# Patient Record
Sex: Male | Born: 1955
Health system: Southern US, Community
[De-identification: ages and names within clinical notes are randomized; demographics above are authoritative.]

## PROBLEM LIST (undated history)

## (undated) DIAGNOSIS — I10 Essential (primary) hypertension: Secondary | ICD-10-CM

## (undated) DIAGNOSIS — E559 Vitamin D deficiency, unspecified: Secondary | ICD-10-CM

## (undated) DIAGNOSIS — Z87442 Personal history of urinary calculi: Secondary | ICD-10-CM

## (undated) HISTORY — PX: TONSILLECTOMY: SUR1361

## (undated) HISTORY — DX: Vitamin D deficiency, unspecified: E55.9

---

## 2008-08-10 ENCOUNTER — Ambulatory Visit: Payer: Self-pay | Admitting: Diagnostic Radiology

## 2008-08-10 ENCOUNTER — Emergency Department (HOSPITAL_BASED_OUTPATIENT_CLINIC_OR_DEPARTMENT_OTHER): Admission: EM | Admit: 2008-08-10 | Discharge: 2008-08-10 | Payer: Self-pay | Admitting: Emergency Medicine

## 2011-10-25 ENCOUNTER — Encounter (HOSPITAL_BASED_OUTPATIENT_CLINIC_OR_DEPARTMENT_OTHER): Payer: Self-pay | Admitting: Family Medicine

## 2011-10-25 ENCOUNTER — Emergency Department (INDEPENDENT_AMBULATORY_CARE_PROVIDER_SITE_OTHER): Payer: PRIVATE HEALTH INSURANCE

## 2011-10-25 ENCOUNTER — Emergency Department (HOSPITAL_BASED_OUTPATIENT_CLINIC_OR_DEPARTMENT_OTHER)
Admission: EM | Admit: 2011-10-25 | Discharge: 2011-10-25 | Disposition: A | Discharge status: Home / Self Care | Payer: PRIVATE HEALTH INSURANCE | Attending: Emergency Medicine | Admitting: Emergency Medicine

## 2011-10-25 DIAGNOSIS — R109 Unspecified abdominal pain: Secondary | ICD-10-CM

## 2011-10-25 DIAGNOSIS — R112 Nausea with vomiting, unspecified: Secondary | ICD-10-CM | POA: Insufficient documentation

## 2011-10-25 LAB — URINE MICROSCOPIC-ADD ON

## 2011-10-25 LAB — URINALYSIS, ROUTINE W REFLEX MICROSCOPIC
Ketones, ur: NEGATIVE mg/dL
Leukocytes, UA: NEGATIVE
Nitrite: NEGATIVE
Protein, ur: NEGATIVE mg/dL

## 2011-10-25 LAB — BASIC METABOLIC PANEL
BUN: 17 mg/dL (ref 6–23)
GFR calc Af Amer: 70 mL/min — ABNORMAL LOW (ref >90)
GFR calc non Af Amer: 61 mL/min — ABNORMAL LOW (ref >90)
Potassium: 4.2 mEq/L (ref 3.5–5.1)
Sodium: 142 mEq/L (ref 135–145)

## 2011-10-25 LAB — CBC
HCT: 40.8 % (ref 39.0–52.0)
MCHC: 35 g/dL (ref 30.0–36.0)
RDW: 13.1 % (ref 11.5–15.5)

## 2011-10-25 MED ORDER — HYDROCODONE-ACETAMINOPHEN 5-325 MG PO TABS: 1.0000 | ORAL_TABLET | Freq: Four times a day (QID) | ORAL | Status: AC | PRN

## 2011-10-25 MED ORDER — ONDANSETRON HCL 4 MG/2ML IJ SOLN
4.0000 mg | Freq: Once | INTRAMUSCULAR | Status: AC
  Administered 2011-10-25: 4 mg via INTRAVENOUS
  Filled 2011-10-25: qty 2

## 2011-10-25 MED ORDER — KETOROLAC TROMETHAMINE 30 MG/ML IJ SOLN
30.0000 mg | Freq: Once | INTRAMUSCULAR | Status: AC
  Administered 2011-10-25: 30 mg via INTRAVENOUS
  Filled 2011-10-25: qty 1

## 2011-10-25 MED ORDER — SODIUM CHLORIDE 0.9 % IV SOLN
INTRAVENOUS | Status: DC
  Administered 2011-10-25: 10:00:00 via INTRAVENOUS

## 2011-10-25 MED ORDER — HYDROMORPHONE HCL PF 1 MG/ML IJ SOLN
1.0000 mg | Freq: Once | INTRAMUSCULAR | Status: AC
  Administered 2011-10-25: 1 mg via INTRAVENOUS
  Filled 2011-10-25: qty 1

## 2011-10-25 MED ORDER — PROMETHAZINE HCL 25 MG PO TABS: 25.0000 mg | ORAL_TABLET | Freq: Four times a day (QID) | ORAL | Status: DC | PRN

## 2011-10-25 MED ORDER — SODIUM CHLORIDE 0.9 % IV BOLUS (SEPSIS)
1000.0000 mL | Freq: Once | INTRAVENOUS | Status: AC
  Administered 2011-10-25: 1000 mL via INTRAVENOUS

## 2011-10-25 MED ORDER — NAPROXEN 500 MG PO TABS: 500.0000 mg | ORAL_TABLET | Freq: Two times a day (BID) | ORAL | Status: AC

## 2011-10-25 NOTE — ED Notes (Signed)
Pt c/o left flank pain since 5 am and with vomiting. Pt able to urinate.

## 2011-10-25 NOTE — ED Notes (Signed)
MD at bedside. 

## 2011-10-25 NOTE — ED Provider Notes (Signed)
History     CSN: [id number]  Arrival date & time 10/25/11  7564   First MD Initiated Contact with Patient 10/25/11 [phone]      Chief Complaint  Patient presents with  . Flank Pain    (Consider location/radiation/quality/duration/timing/severity/associated sxs/prior treatment) Patient is a 56 y.o. male presenting with flank pain. The history is provided by the patient and the spouse.  Flank Pain This is a new problem. The current episode started 3 to 5 hours ago. The problem occurs constantly. The problem has not changed since onset.Associated symptoms include abdominal pain. Pertinent negatives include no chest pain, no headaches and no shortness of breath. The symptoms are aggravated by nothing. The symptoms are relieved by nothing. He has tried nothing for the symptoms.   Acute onset of left-sided the flank pain at 5:00 this morning associated with nausea and vomiting no past history of kidney stones no primary care doctor currently. Pain is 10 out of 10 described as sharp not made better by anything radiates from the left flank down towards the left lower quadrant. History reviewed. No pertinent past medical history.  History reviewed. No pertinent past surgical history.  No family history on file.  History  Substance Use Topics  . Smoking status: Never Smoker   . Smokeless tobacco: Not on file  . Alcohol Use: No      Review of Systems  Constitutional: Negative for fever and chills.  HENT: Negative for congestion and neck pain.   Eyes: Negative for redness.  Respiratory: Negative for shortness of breath.   Cardiovascular: Negative for chest pain.  Gastrointestinal: Positive for nausea, vomiting and abdominal pain. Negative for diarrhea.  Genitourinary: Positive for flank pain and decreased urine volume.  Musculoskeletal: Negative for back pain.  Skin: Negative for rash.  Neurological: Negative for headaches.  Hematological: Does not bruise/bleed easily.    Allergies    Review of patient's allergies indicates no known allergies.  Home Medications   Current Outpatient Rx  Name Route Sig Dispense Refill  . ASPIRIN 325 MG PO TABS Oral Take 325 mg by mouth daily as needed.      BP 191/107  Pulse 68  Temp(Src) 97.6 F (36.4 C) (Oral)  Resp 15  Ht 5\' 11"  (1.803 m)  Wt 270 lb (122.471 kg)  BMI 37.66 kg/m2  SpO2 99%  Physical Exam  Nursing note and vitals reviewed. Constitutional: He is oriented to person, place, and time. He appears well-developed and well-nourished.  HENT:  Head: Normocephalic and atraumatic.  Mouth/Throat: Oropharynx is clear and moist.  Eyes: Conjunctivae and EOM are normal. Pupils are equal, round, and reactive to light.  Neck: Normal range of motion. Neck supple.  Cardiovascular: Normal rate and regular rhythm.   No murmur heard. Pulmonary/Chest: Effort normal and breath sounds normal.  Abdominal: Soft. Bowel sounds are normal. There is no tenderness.  Musculoskeletal: Normal range of motion. He exhibits no edema and no tenderness.  Neurological: He is alert and oriented to person, place, and time. No cranial nerve deficit. He exhibits normal muscle tone. Coordination normal.  Skin: Skin is warm. No rash noted. He is not diaphoretic.    ED Course  Procedures (including critical care time)  Labs Reviewed  URINALYSIS, ROUTINE W REFLEX MICROSCOPIC - Abnormal; Notable for the following:    Hgb urine dipstick SMALL (*)    All other components within normal limits  BASIC METABOLIC PANEL - Abnormal; Notable for the following:    Glucose, Bld 160 (*)  GFR calc non Af Amer 61 (*)    GFR calc Af Amer 70 (*)    All other components within normal limits  CBC  URINE MICROSCOPIC-ADD ON   US Renal  10/25/2011  *RADIOLOGY REPORT*  Clinical Data: Left flank pain.  RENAL/URINARY TRACT ULTRASOUND COMPLETE  Comparison:  None.  Findings:  Right Kidney:  11.6 cm. Normal size and echotexture.  No focal abnormality.  No hydronephrosis.   Left Kidney:  13.1 cm. Normal size and echotexture.  No focal abnormality.  No hydronephrosis.  Bladder:  Bilateral ureteral jets visualized.  Normal appearance.  IMPRESSION: Normal study.  Original Report Authenticated By: Cyndie Chime, M.D.   Results for orders placed during the hospital encounter of 10/25/11  URINALYSIS, ROUTINE W REFLEX MICROSCOPIC      Component Value Range   Color, Urine YELLOW  YELLOW    APPearance CLEAR  CLEAR    Specific Gravity, Urine 1.020  1.005 - 1.030    pH 5.0  5.0 - 8.0    Glucose, UA NEGATIVE  NEGATIVE (mg/dL)   Hgb urine dipstick SMALL (*) NEGATIVE    Bilirubin Urine NEGATIVE  NEGATIVE    Ketones, ur NEGATIVE  NEGATIVE (mg/dL)   Protein, ur NEGATIVE  NEGATIVE (mg/dL)   Urobilinogen, UA 0.2  0.0 - 1.0 (mg/dL)   Nitrite NEGATIVE  NEGATIVE    Leukocytes, UA NEGATIVE  NEGATIVE   CBC      Component Value Range   WBC 8.5  4.0 - 10.5 (K/uL)   RBC 4.73  4.22 - 5.81 (MIL/uL)   Hemoglobin 14.3  13.0 - 17.0 (g/dL)   HCT 16.1  09.6 - 04.5 (%)   MCV 86.3  78.0 - 100.0 (fL)   MCH 30.2  26.0 - 34.0 (pg)   MCHC 35.0  30.0 - 36.0 (g/dL)   RDW 40.9  81.1 - 91.4 (%)   Platelets 251  150 - 400 (K/uL)  BASIC METABOLIC PANEL      Component Value Range   Sodium 142  135 - 145 (mEq/L)   Potassium 4.2  3.5 - 5.1 (mEq/L)   Chloride 105  96 - 112 (mEq/L)   CO2 29  19 - 32 (mEq/L)   Glucose, Bld 160 (*) 70 - 99 (mg/dL)   BUN 17  6 - 23 (mg/dL)   Creatinine, Ser 7.82  0.50 - 1.35 (mg/dL)   Calcium 9.2  8.4 - 95.6 (mg/dL)   GFR calc non Af Amer 61 (*) >90 (mL/min)   GFR calc Af Amer 70 (*) >90 (mL/min)  URINE MICROSCOPIC-ADD ON      Component Value Range   Squamous Epithelial / LPF RARE  RARE    WBC, UA 0-2  <3 (WBC/hpf)   RBC / HPF 3-6  <3 (RBC/hpf)   Bacteria, UA RARE  RARE      1. Left flank pain       MDM  Currently CT scanner is down   and it is not known when it'll be fixed. Patient clinically has a left kidney stone based on hematuria and also  the fact that Toradol significantly helped the left flank pain. Have ordered ultrasound will be available until 11 PM.  Clinically symptoms very consistent with left renal colic mild hematuria patient needed to move with the pain also not helped much with the lauded but significantly helped portable IV. Ultrasound without any significant abnormalities still clinically feel that this is consistent with a stone. As stated above CAT  scan still not available current time is 11:00 AM  The patient's pain has resolved suspect he has passed a stone. Despite the ultrasound results pain was gone and he did do the ultrasound. Will refer urology patient will return here if pain reoccurs will check to see if CT scan is operative if not we may refer him to somewhere else.  Shelda Jakes, MD 10/25/11 [phone]

## 2011-10-25 NOTE — ED Notes (Signed)
Patient transported to Ultrasound 

## 2011-10-25 NOTE — Discharge Instructions (Signed)
Symptoms most likely consistent with a left-sided kidney stone. Take medications as needed would take the Naprosyn on every 12 hours and supplemented with the Norco as needed for recurring or worse pain. Followup with urology. Return to the emergency department for any new worse symptoms or recurrent pain. May want to call to verify CAT scan is working before arrival because that's what she would need.

## 2014-07-26 ENCOUNTER — Ambulatory Visit
Admission: RE | Admit: 2014-07-26 | Discharge: 2014-07-26 | Disposition: A | Discharge status: Home / Self Care | Payer: PRIVATE HEALTH INSURANCE | Source: Ambulatory Visit | Attending: Family Medicine | Admitting: Family Medicine

## 2014-07-26 ENCOUNTER — Other Ambulatory Visit: Payer: Self-pay | Admitting: Family Medicine

## 2014-07-26 DIAGNOSIS — R079 Chest pain, unspecified: Secondary | ICD-10-CM

## 2014-07-26 DIAGNOSIS — R0781 Pleurodynia: Secondary | ICD-10-CM

## 2014-11-12 ENCOUNTER — Ambulatory Visit (HOSPITAL_COMMUNITY)
Admission: RE | Admit: 2014-11-12 | Discharge: 2014-11-12 | Disposition: A | Discharge status: Home / Self Care | Payer: PRIVATE HEALTH INSURANCE | Source: Ambulatory Visit | Attending: Cardiology | Admitting: Cardiology

## 2014-11-12 ENCOUNTER — Other Ambulatory Visit: Payer: Self-pay | Admitting: Cardiology

## 2014-11-12 DIAGNOSIS — Z87891 Personal history of nicotine dependence: Secondary | ICD-10-CM | POA: Insufficient documentation

## 2014-11-12 DIAGNOSIS — R0602 Shortness of breath: Secondary | ICD-10-CM | POA: Insufficient documentation

## 2014-11-12 LAB — BRAIN NATRIURETIC PEPTIDE: B NATRIURETIC PEPTIDE 5: 19.1 pg/mL (ref 0.0–100.0)

## 2016-09-16 ENCOUNTER — Encounter (HOSPITAL_BASED_OUTPATIENT_CLINIC_OR_DEPARTMENT_OTHER): Payer: Self-pay | Admitting: *Deleted

## 2016-09-16 ENCOUNTER — Emergency Department (HOSPITAL_BASED_OUTPATIENT_CLINIC_OR_DEPARTMENT_OTHER): Payer: Commercial Managed Care - PPO

## 2016-09-16 ENCOUNTER — Emergency Department (HOSPITAL_BASED_OUTPATIENT_CLINIC_OR_DEPARTMENT_OTHER)
Admission: EM | Admit: 2016-09-16 | Discharge: 2016-09-16 | Disposition: A | Discharge status: Home / Self Care | Payer: Commercial Managed Care - PPO | Attending: Physician Assistant | Admitting: Physician Assistant

## 2016-09-16 DIAGNOSIS — I10 Essential (primary) hypertension: Secondary | ICD-10-CM | POA: Diagnosis not present

## 2016-09-16 DIAGNOSIS — N200 Calculus of kidney: Secondary | ICD-10-CM | POA: Insufficient documentation

## 2016-09-16 DIAGNOSIS — F17290 Nicotine dependence, other tobacco product, uncomplicated: Secondary | ICD-10-CM | POA: Diagnosis not present

## 2016-09-16 DIAGNOSIS — R109 Unspecified abdominal pain: Secondary | ICD-10-CM

## 2016-09-16 DIAGNOSIS — Z7982 Long term (current) use of aspirin: Secondary | ICD-10-CM | POA: Diagnosis not present

## 2016-09-16 HISTORY — DX: Essential (primary) hypertension: I10

## 2016-09-16 LAB — CBC WITH DIFFERENTIAL/PLATELET
BASOS PCT: 0 %
Basophils Absolute: 0 10*3/uL (ref 0.0–0.1)
Eosinophils Absolute: 0.1 10*3/uL (ref 0.0–0.7)
Eosinophils Relative: 0 %
HEMATOCRIT: 41.4 % (ref 39.0–52.0)
Hemoglobin: 14.4 g/dL (ref 13.0–17.0)
LYMPHS ABS: 2 10*3/uL (ref 0.7–4.0)
Lymphocytes Relative: 15 %
MCH: 30.1 pg (ref 26.0–34.0)
MCHC: 34.8 g/dL (ref 30.0–36.0)
MCV: 86.6 fL (ref 78.0–100.0)
MONO ABS: 1.5 10*3/uL — AB (ref 0.1–1.0)
MONOS PCT: 11 %
NEUTROS ABS: 9.9 10*3/uL — AB (ref 1.7–7.7)
Neutrophils Relative %: 74 %
Platelets: 273 10*3/uL (ref 150–400)
RBC: 4.78 MIL/uL (ref 4.22–5.81)
RDW: 13.2 % (ref 11.5–15.5)
WBC: 13.5 10*3/uL — ABNORMAL HIGH (ref 4.0–10.5)

## 2016-09-16 LAB — URINALYSIS, MICROSCOPIC (REFLEX)

## 2016-09-16 LAB — BASIC METABOLIC PANEL
Anion gap: 9 (ref 5–15)
BUN: 18 mg/dL (ref 6–20)
CALCIUM: 9.3 mg/dL (ref 8.9–10.3)
CO2: 28 mmol/L (ref 22–32)
CREATININE: 1.62 mg/dL — AB (ref 0.61–1.24)
Chloride: 98 mmol/L — ABNORMAL LOW (ref 101–111)
GFR calc non Af Amer: 45 mL/min — ABNORMAL LOW (ref >60)
GFR, EST AFRICAN AMERICAN: 52 mL/min — AB (ref >60)
GLUCOSE: 109 mg/dL — AB (ref 65–99)
Potassium: 4.1 mmol/L (ref 3.5–5.1)
Sodium: 135 mmol/L (ref 135–145)

## 2016-09-16 LAB — URINALYSIS, ROUTINE W REFLEX MICROSCOPIC
Bilirubin Urine: NEGATIVE
Glucose, UA: NEGATIVE mg/dL
Ketones, ur: 15 mg/dL — AB
LEUKOCYTES UA: NEGATIVE
Nitrite: NEGATIVE
PROTEIN: NEGATIVE mg/dL
Specific Gravity, Urine: 1.018 (ref 1.005–1.030)
pH: 6 (ref 5.0–8.0)

## 2016-09-16 MED ORDER — OXYCODONE-ACETAMINOPHEN 5-325 MG PO TABS
1.0000 | ORAL_TABLET | Freq: Once | ORAL | Status: AC
Start: 2016-09-16 — End: 2016-09-16
  Administered 2016-09-16: 1 via ORAL
  Filled 2016-09-16: qty 1

## 2016-09-16 MED ORDER — KETOROLAC TROMETHAMINE 30 MG/ML IJ SOLN
15.0000 mg | Freq: Once | INTRAMUSCULAR | Status: AC
Start: 2016-09-16 — End: 2016-09-16
  Administered 2016-09-16: 15 mg via INTRAVENOUS
  Filled 2016-09-16: qty 1

## 2016-09-16 MED ORDER — SODIUM CHLORIDE 0.9 % IV BOLUS (SEPSIS)
1000.0000 mL | Freq: Once | INTRAVENOUS | Status: AC
Start: 2016-09-16 — End: 2016-09-16
  Administered 2016-09-16: 1000 mL via INTRAVENOUS

## 2016-09-16 MED ORDER — OXYCODONE-ACETAMINOPHEN 5-325 MG PO TABS: 1.0000 | ORAL_TABLET | Freq: Four times a day (QID) | ORAL | 0 refills | Status: DC | PRN

## 2016-09-16 MED ORDER — ONDANSETRON 4 MG PO TBDP
4.0000 mg | ORAL_TABLET | Freq: Once | ORAL | Status: AC
  Administered 2016-09-16: 4 mg via ORAL
  Filled 2016-09-16: qty 1

## 2016-09-16 MED ORDER — ONDANSETRON 4 MG PO TBDP: 4.0000 mg | ORAL_TABLET | Freq: Three times a day (TID) | ORAL | 0 refills | Status: DC | PRN

## 2016-09-16 MED ORDER — TAMSULOSIN HCL 0.4 MG PO CAPS: 0.4000 mg | ORAL_CAPSULE | Freq: Every day | ORAL | 0 refills | Status: DC

## 2016-09-16 MED FILL — ONDANSETRON ODT 4 MG TABLET: 4 | 30 days supply | Qty: 15 | Fill #0

## 2016-09-16 MED FILL — TAMSULOSIN HCL 0.4 MG CAP: 0.4 | 30 days supply | Qty: 30 | Fill #0

## 2016-09-16 MED FILL — OXYCODONE/APAP 5/325 MG TAB: 5-325 | 3 days supply | Qty: 15 | Fill #0

## 2016-09-16 NOTE — ED Triage Notes (Addendum)
Right flank pain since yesterday. Hx of kidney stone in 2013. He has been taking ASA for the pain. Pt is snoring in the chair at triage before triage assessment was complete. Wife answered questions.

## 2016-09-16 NOTE — ED Provider Notes (Signed)
[facility]   CSN: [id number] Arrival date & time: 09/16/16  1118     History   Chief Complaint Chief Complaint  Patient presents with  . Flank Pain    HPI Bryan Melton is a 61 y.o. male.  HPI   Patient is a 61 year old male presenting with right flank pain. He reports started yesterday. Patient has history of kidney stones and has been treated by Dr. Venetia Constable in the past. Patient reports no fever no dysuria.  Patient had mild nausea, no vomiting.  Past Medical History:  Diagnosis Date  . Hypertension     There are no active problems to display for this patient.   History reviewed. No pertinent surgical history.     Home Medications    Prior to Admission medications   Medication Sig Start Date End Date Taking? Authorizing Provider  aspirin 325 MG tablet Take 325 mg by mouth daily as needed.    Historical Provider, MD  promethazine (PHENERGAN) 25 MG tablet Take 1 tablet (25 mg total) by mouth every 6 (six) hours as needed for nausea. 10/25/11 11/01/11  Vanetta Mulders, MD    Family History No family history on file.  Social History Social History  Substance Use Topics  . Smoking status: Current Every Day Smoker    Types: Cigars  . Smokeless tobacco: Never Used  . Alcohol use No     Allergies   Patient has no known allergies.   Review of Systems Review of Systems  Constitutional: Negative for activity change and fever.  Respiratory: Negative for cough and shortness of breath.   Cardiovascular: Negative for chest pain.  Gastrointestinal: Positive for nausea. Negative for diarrhea and vomiting.  Genitourinary: Negative for dysuria and frequency.  Musculoskeletal: Positive for back pain.  Skin: Negative for rash.  All other systems reviewed and are negative.    Physical Exam Updated Vital Signs BP 125/81 (BP Location: Right Arm)   Pulse 93   Temp 98.4 F (36.9 C) (Oral)   Resp 18   Ht  (1.803 m)   Wt 290 lb  (131.5 kg)   SpO2 98%   BMI 40.45 kg/m   Physical Exam  Constitutional: He is oriented to person, place, and time. He appears well-nourished.  HENT:  Head: Normocephalic and atraumatic.  Eyes: Conjunctivae are normal. Pupils are equal, round, and reactive to light.  Neck: Normal range of motion.  Cardiovascular: Normal rate.   Pulmonary/Chest: Effort normal and breath sounds normal.  Abdominal: Soft. He exhibits no distension. There is no tenderness.  Neurological: He is oriented to person, place, and time.  Skin: Skin is warm and dry. He is not diaphoretic.  Psychiatric: He has a normal mood and affect. His behavior is normal.     ED Treatments / Results  Labs (all labs ordered are listed, but only abnormal results are displayed) Labs Reviewed  URINALYSIS, ROUTINE W REFLEX MICROSCOPIC - Abnormal; Notable for the following:       Result Value   Hgb urine dipstick SMALL (*)    Ketones, ur 15 (*)    All other components within normal limits  URINALYSIS, MICROSCOPIC (REFLEX) - Abnormal; Notable for the following:    Bacteria, UA RARE (*)    Squamous Epithelial / LPF 0-5 (*)    All other components within normal limits  CBC WITH DIFFERENTIAL/PLATELET - Abnormal; Notable for the following:    WBC 13.5 (*)    Neutro Abs 9.9 (*)  Monocytes Absolute 1.5 (*)    All other components within normal limits  BASIC METABOLIC PANEL    EKG  EKG Interpretation None       Radiology No results found.  Procedures Procedures (including critical care time)  Medications Ordered in ED Medications - No data to display   Initial Impression / Assessment and Plan / ED Course  I have reviewed the triage vital signs and the nursing notes.  Pertinent labs & imaging results that were available during my care of the patient were reviewed by me and considered in my medical decision making (see chart for details).     Patient is a 61 year old male presenting with right leg pain.  Patient has no dysuria. Patient has no  infection seen on urine. We'll do CT to make sure is it is non obstructive. Patient appears very comfortable exam we'll give oral medications to try to control pain at this time.  2:54 PM CT shows 6 mm stone with mild right Hydro.   Anticipate ability to discharge home and follow-up with Dr. Venetia Constable as an outpatient.  Discussed with oncall urologist, Will discharge with return precautions.   Final Clinical Impressions(s) / ED Diagnoses   Final diagnoses:  None    New Prescriptions New Prescriptions   No medications on file     Valyncia Wiens Randall An, MD 09/17/16 [phone]

## 2016-09-28 ENCOUNTER — Encounter: Payer: Self-pay | Admitting: Vascular Surgery

## 2016-10-06 ENCOUNTER — Encounter: Payer: Self-pay | Admitting: Vascular Surgery

## 2016-10-13 ENCOUNTER — Encounter: Payer: Self-pay | Admitting: Vascular Surgery

## 2016-10-13 ENCOUNTER — Ambulatory Visit (INDEPENDENT_AMBULATORY_CARE_PROVIDER_SITE_OTHER): Payer: Commercial Managed Care - PPO | Admitting: Vascular Surgery

## 2016-10-13 VITALS — BP 119/75 | HR 105 | Temp 97.5°F | Resp 20 | Ht 71.0 in | Wt 294.0 lb

## 2016-10-13 DIAGNOSIS — I722 Aneurysm of renal artery: Secondary | ICD-10-CM

## 2016-10-13 NOTE — Progress Notes (Signed)
Patient name: Bryan Melton MRN: [id number] DOB: [birth year] Sex: male   REASON FOR CONSULT:    Renal artery aneurysm. Referred by Jetta Lout.   HPI:   Bryan Melton is a 61 y.o. male, who had developed right flank pain approximate week and a half ago and underwent a CT scan to workup a kidney stone. He was found to have kidney stones. 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 The patient is referred for vascular consultation.  He continues to have intermittent right flank pain and has a ureteral calculus which has not yet passed. He denies any significant gross hematuria. He states that his blood pressure has been under good control.   He does not smoke cigarettes but does smoke an occasional cigar.  Past Medical History:  Diagnosis Date  . Hydronephrosis   . Hypertension   . Vitamin D deficiency     No family history on file.  SOCIAL HISTORY: Social History   Social History  . Marital status: Single    Spouse name: N/A  . Number of children: N/A  . Years of education: N/A   Occupational History  . Not on file.   Social History Main Topics  . Smoking status: Light Tobacco Smoker    Types: Cigars  . Smokeless tobacco: Never Used     Comment: "Only when stressed"  . Alcohol use No  . Drug use: No  . Sexual activity: Not on file   Other Topics Concern  . Not on file   Social History Narrative  . No narrative on file    No Known Allergies  Current Outpatient Prescriptions  Medication Sig Dispense Refill  . amLODipine (NORVASC) 10 MG tablet TAKE 1 TABLET BY MOUTH EVERY DAY IN THE AM  1  . aspirin 325 MG tablet Take 325 mg by mouth daily as needed.    Marland Kitchen CIALIS 10 MG tablet TAKE 1 TABLET BY MOUTH EVERY 72 HOURS AS NEEDED  6  . hydrochlorothiazide (HYDRODIURIL) 25 MG tablet Take 25 mg by mouth daily.  1  . lisinopril (PRINIVIL,ZESTRIL) 40 MG tablet Take 40 mg by mouth daily.  0  . ondansetron (ZOFRAN ODT) 4 MG disintegrating tablet Take  1 tablet (4 mg total) by mouth every 8 (eight) hours as needed for nausea or vomiting. 20 tablet 0  . tamsulosin (FLOMAX) 0.4 MG CAPS capsule Take 1 capsule (0.4 mg total) by mouth daily. 30 capsule 0  . triazolam (HALCION) 0.25 MG tablet TAKE 1 TABLET BY MOUTH AT NIGHT BEFORE DENTAL APPT & 1 ONE HOUR BEFORE APPT  0  . promethazine (PHENERGAN) 25 MG tablet Take 1 tablet (25 mg total) by mouth every 6 (six) hours as needed for nausea. 10 tablet 0   No current facility-administered medications for this visit.     REVIEW OF SYSTEMS:   denotes positive finding,  denotes negative finding Cardiac  Comments:  Chest pain or chest pressure:    Shortness of breath upon exertion:    Short of breath when lying flat:    Irregular heart rhythm:        Vascular    Pain in calf, thigh, or hip brought on by ambulation:    Pain in feet at night that wakes you up from your sleep:     Blood clot in your veins:    Leg swelling:         Pulmonary    Oxygen at home:  Productive cough:     Wheezing:         Neurologic    Sudden weakness in arms or legs:     Sudden numbness in arms or legs:     Sudden onset of difficulty speaking or slurred speech:    Temporary loss of vision in one eye:     Problems with dizziness:         Gastrointestinal    Blood in stool:     Vomited blood:         Genitourinary    Burning when urinating:     Blood in urine:        Psychiatric    Major depression:         Hematologic    Bleeding problems:    Problems with blood clotting too easily:        Skin    Rashes or ulcers:        Constitutional    Fever or chills:     PHYSICAL EXAM:   Vitals:   10/13/16 1238  BP: 119/75  Pulse: (!) 105  Resp: 20  Temp: 97.5 F (36.4 C)  TempSrc: Oral  SpO2: 96%  Weight: 294 lb (133.4 kg)  Height:  (1.803 m)    GENERAL: The patient is a well-nourished male, in no acute distress. The vital signs are documented above. CARDIAC: There is a regular  rate and rhythm.  VASCULAR:I do not detect carotid bruits.  I do not detect abdominal bruits.  Both feet are warm and well-perfused.  PULMONARY: There is good air exchange bilaterally without wheezing or rales. ABDOMEN: Soft and non-tender with normal pitched bowel sounds.  MUSCULOSKELETAL: There are no major deformities or cyanosis. NEUROLOGIC: No focal weakness or paresthesias are detected. SKIN: There are no ulcers or rashes noted. PSYCHIATRIC: The patient has a normal affect.  DATA:    CT SCAN: I have reviewed the CT scan which was done on 09/16/2016. This was done to workup a kidney stone. The patient did have a 6 mm distal right ureteral calculus just proximal to the UVJ with moderate right hydronephrosis. There was a nonobstructing left renal calculus also. 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  LABS: Her creatinine on 09/16/2016 was 1.62. Creatinine clearance was 45.  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 This is fairly small and I have simply recommended a follow up CT scan in 1 year. If this does not change in size then I think we can extend his follow up out to 2 years after that and ultimately potentially longer remains unchanged in size. We have discussed the importance of good blood pressure control and I have recommended tobacco cessation. I'll see him back in 1 year with a follow up CT scan. He knows to call sooner if he has problems.  Waverly Ferrari [facility] [phone]

## 2016-10-14 NOTE — Addendum Note (Signed)
Addended by: Burton ApleyPETTY, Adamari Frede A on: 10/14/2016 09:51 AM   Modules accepted: Orders

## 2016-11-04 ENCOUNTER — Other Ambulatory Visit: Payer: Self-pay | Admitting: Urology

## 2016-11-09 NOTE — Patient Instructions (Signed)
Larence PenningLeandre Leitz  11/09/2016   Your procedure is scheduled on: 11/12/16  [facility]  [facility]trance [place]  elevators to 3rd floor to  Short Stay Center at     0815 AM.    Call this number if you have problems the morning of surgery [phone]    Remember: ONLY 1 PERSON MAY GO WITH YOU TO SHORT STAY TO GET  READY MORNING OF YOUR SURGERY.  Do not eat food or drink liquids :After Midnight.     Take these medicines the morning of surgery with A SIP OF WATER: amlodipine                                You may not have any metal on your body including hair pins and              piercings  Do not wear jewelry,, lotions, powders or perfumes, deodorant         .              Men may shave face and neck.   Do not bring valuables to the hospital. [facility] IS NOT             RESPONSIBLE   FOR VALUABLES.  Contacts, dentures or bridgework may not be worn into surgery.  Leave suitcase in the car. After surgery it may be brought to your room.     Patients discharged the day of surgery will not be allowed to drive home.  Name and phone number of your driver:  Special Instructions: N/A              Please read over the following fact sheets you were given: _____________________________________________________________________             [facility] - Pre[facility] Before surgery, you can play an important role.  Because skin is not sterile, your skin needs to be as free of germs as possible.  You can reduce the number of germs on your skin by washing with CHG (chlorahexidine gluconate) soap before surgery.  CHG is an antiseptic cleaner which kills germs and bonds with the skin to continue killing germs even after washing. Please DO NOT use if you have an allergy to CHG or antibacterial soaps.  If your skin becomes reddened/irritated stop using the CHG and inform your nurse when you arrive at Short Stay. Do not shave (including legs and underarms)  for at least 48 hours prior to the first CHG shower.  You may shave your face/neck. Please follow these instructions carefully:  1.  Shower with CHG Soap the night before surgery and the  morning of Surgery.  2.  If you choose to wash your hair, wash your hair first as usual with your  normal  shampoo.  3.  After you shampoo, rinse your hair and body thoroughly to remove the  shampoo.                           4.  Use CHG as you would any other liquid soap.  You can apply chg directly  to the skin and wash                       Gently with  a scrungie or clean washcloth.  5.  Apply the CHG Soap to your body ONLY FROM THE NECK DOWN.   Do not use on face/ open                           Wound or open sores. Avoid contact with eyes, ears mouth and genitals (private parts).                       Wash face,  Genitals (private parts) with your normal soap.             6.  Wash thoroughly, paying special attention to the area where your surgery  will be performed.  7.  Thoroughly rinse your body with warm water from the neck down.  8.  DO NOT shower/wash with your normal soap after using and rinsing off  the CHG Soap.                9.  Pat yourself dry with a clean towel.            10.  Wear clean pajamas.            11.  Place clean sheets on your bed the night of your first shower and do not  sleep with pets. Day of Surgery : Do not apply any lotions/deodorants the morning of surgery.  Please wear clean clothes to the hospital/surgery center.  FAILURE TO FOLLOW THESE INSTRUCTIONS MAY RESULT IN THE CANCELLATION OF YOUR SURGERY PATIENT SIGNATURE_________________________________  NURSE SIGNATURE__________________________________  ________________________________________________________________________

## 2016-11-09 NOTE — Progress Notes (Signed)
LOV cards 2016  On chart

## 2016-11-10 ENCOUNTER — Encounter (HOSPITAL_COMMUNITY)
Admission: RE | Admit: 2016-11-10 | Discharge: 2016-11-10 | Disposition: A | Discharge status: Home / Self Care | Payer: Commercial Managed Care - PPO | Source: Ambulatory Visit | Attending: Urology | Admitting: Urology

## 2016-11-10 ENCOUNTER — Encounter (HOSPITAL_COMMUNITY): Payer: Self-pay

## 2016-11-10 ENCOUNTER — Encounter (INDEPENDENT_AMBULATORY_CARE_PROVIDER_SITE_OTHER): Payer: Self-pay

## 2016-11-10 DIAGNOSIS — E559 Vitamin D deficiency, unspecified: Secondary | ICD-10-CM | POA: Diagnosis not present

## 2016-11-10 DIAGNOSIS — E291 Testicular hypofunction: Secondary | ICD-10-CM | POA: Diagnosis not present

## 2016-11-10 DIAGNOSIS — N135 Crossing vessel and stricture of ureter without hydronephrosis: Secondary | ICD-10-CM | POA: Diagnosis not present

## 2016-11-10 DIAGNOSIS — Z79899 Other long term (current) drug therapy: Secondary | ICD-10-CM | POA: Diagnosis not present

## 2016-11-10 DIAGNOSIS — I722 Aneurysm of renal artery: Secondary | ICD-10-CM | POA: Diagnosis not present

## 2016-11-10 DIAGNOSIS — T39315A Adverse effect of propionic acid derivatives, initial encounter: Secondary | ICD-10-CM | POA: Diagnosis not present

## 2016-11-10 DIAGNOSIS — Z720 Tobacco use: Secondary | ICD-10-CM | POA: Diagnosis not present

## 2016-11-10 DIAGNOSIS — I10 Essential (primary) hypertension: Secondary | ICD-10-CM | POA: Diagnosis not present

## 2016-11-10 DIAGNOSIS — K5903 Drug induced constipation: Secondary | ICD-10-CM | POA: Diagnosis not present

## 2016-11-10 DIAGNOSIS — N400 Enlarged prostate without lower urinary tract symptoms: Secondary | ICD-10-CM | POA: Diagnosis not present

## 2016-11-10 DIAGNOSIS — N201 Calculus of ureter: Secondary | ICD-10-CM | POA: Diagnosis not present

## 2016-11-10 DIAGNOSIS — N528 Other male erectile dysfunction: Secondary | ICD-10-CM | POA: Diagnosis not present

## 2016-11-10 DIAGNOSIS — Z6841 Body Mass Index (BMI) 40.0 and over, adult: Secondary | ICD-10-CM | POA: Diagnosis not present

## 2016-11-10 DIAGNOSIS — Z8249 Family history of ischemic heart disease and other diseases of the circulatory system: Secondary | ICD-10-CM | POA: Diagnosis not present

## 2016-11-10 DIAGNOSIS — Z7982 Long term (current) use of aspirin: Secondary | ICD-10-CM | POA: Diagnosis not present

## 2016-11-10 HISTORY — DX: Personal history of urinary calculi: Z87.442

## 2016-11-10 LAB — BASIC METABOLIC PANEL
ANION GAP: 7 (ref 5–15)
BUN: 19 mg/dL (ref 6–20)
CALCIUM: 9.6 mg/dL (ref 8.9–10.3)
CHLORIDE: 107 mmol/L (ref 101–111)
CO2: 28 mmol/L (ref 22–32)
CREATININE: 1.16 mg/dL (ref 0.61–1.24)
GFR calc non Af Amer: >60 mL/min (ref >60)
Glucose, Bld: 106 mg/dL — ABNORMAL HIGH (ref 65–99)
Potassium: 3.9 mmol/L (ref 3.5–5.1)
Sodium: 142 mmol/L (ref 135–145)

## 2016-11-10 LAB — CBC
HEMATOCRIT: 38.2 % — AB (ref 39.0–52.0)
HEMOGLOBIN: 13.1 g/dL (ref 13.0–17.0)
MCH: 29.6 pg (ref 26.0–34.0)
MCHC: 34.3 g/dL (ref 30.0–36.0)
MCV: 86.4 fL (ref 78.0–100.0)
Platelets: 280 10*3/uL (ref 150–400)
RBC: 4.42 MIL/uL (ref 4.22–5.81)
RDW: 13.4 % (ref 11.5–15.5)
WBC: 9.7 10*3/uL (ref 4.0–10.5)

## 2016-11-10 NOTE — Progress Notes (Signed)
Final ekg done 11/10/16 in epic

## 2016-11-11 MED ORDER — DEXTROSE 5 % IV SOLN
3.0000 g | INTRAVENOUS | Status: AC
  Administered 2016-11-12: 3 g via INTRAVENOUS
  Filled 2016-11-11: qty 3000

## 2016-11-11 NOTE — H&P (Signed)
Office Visit Report     11/03/2016   --------------------------------------------------------------------------------   Bryan PenningLeandre Blanton  MRN: [id number]  PRIMARY CARE:  Fanny DanceVictoria R. Rankins, MD  DOB: December 13, 1955, 61 year old Male  REFERRING:  TurkeyVictoria R. Rankins, MD    PROVIDER:  Jetta Loutiane Warden    TREATING:  Jethro BolusSigmund Sonji Starkes, M.D.    LOCATION:  [facility] [phone]   --------------------------------------------------------------------------------   CC: I have ureteral stone.  HPI: Bryan Melton is a 61 year-old male established patient who is here for ureteral stone.  The problem is on the right side. He first stated noticing pain on 09/16/2016. He is currently having flank pain and nausea. He denies having back pain, groin pain, vomiting, fever, and chills. Pain is occuring on the right side. He has not caught a stone in his urine strainer since his symptoms began.   Having some constipation with PRN pain meds. LBM was 24 hr ago. Did self treated Miralax yesterday. Pain is much improved after BM. No longer using PRN Hydrocodone (ran out) but remains on PRN Ibuprofen and daily Tamsulosin.      ALLERGIES: No Allergies    MEDICATIONS: Hydrocodone-Acetaminophen 5 mg-325 mg tablet 1 tablet PO Q 6 H PRN  Ibuprofen 800 mg tablet 1 tablet PO TID PRN  Lisinopril-Hydrochlorothiazide 20 mg-25 mg tablet Oral     GU PSH: None   NON-GU PSH: Remove Tonsils - 2014    GU PMH: Renal calculus (Acute), Left, Stable left renal calculus. - 09/24/2016 Ureteral calculus (Acute), Right, Ketorolac 60 mg IM today (refused). Stable distal right UVJ calculus. Given strainer. Instructed to strain urine and if stone captured bring to office. Will continue with daily Tamsulosin. May use NSAID for pain along with narcotic. Instructed to contact oncall over weekend if he has temp >100.5, intractable pain, or vomiting. - 09/24/2016 Ureteral obstruction secondary to calculous (Acute), Right,  Secondary to distal right UVJ calculus. - 09/24/2016 ED due to arterial insufficiency, Erectile dysfunction due to arterial insufficiency - 2015 Primary hypogonadism, Hypogonadism, testicular - 2015    NON-GU PMH: Aneurysm of renal artery (Acute), Right, [facility] for [facility] - 09/24/2016 Encounter for general adult medical examination without abnormal findings, Encounter for preventive health examination - 2015 Personal history of other diseases of the circulatory system, History of hypertension - 2014 Vitamin D deficiency, unspecified, Vitamin D deficiency - 2014    FAMILY HISTORY: Family Health Status - Mother's Age - Runs In Family Family Health Status Number - Runs In Family Father Deceased At [employer] [facility] ___ - Runs In Family hyperlipidemia - Mother Hypertension - Mother   SOCIAL HISTORY: Marital Status: Single Current Smoking Status: Patient smokes occasionally.   Tobacco Use Assessment Completed: Used Tobacco in last 30 days? Does not use smokeless tobacco. Has never drank.  Does not use drugs. Drinks 1 caffeinated drink per day.    REVIEW OF SYSTEMS:    GU Review Male:   Patient denies frequent urination, hard to postpone urination, burning/ pain with urination, get up at night to urinate, leakage of urine, stream starts and stops, trouble starting your stream, have to strain to urinate , erection problems, and penile pain.  Gastrointestinal (Upper):   Patient denies nausea, vomiting, and indigestion/ heartburn.  Gastrointestinal (Lower):   Patient denies diarrhea and constipation.  Constitutional:   Patient denies fever, night sweats, weight loss, and fatigue.  Skin:   Patient denies skin rash/ lesion and itching.  Eyes:   Patient denies  blurred vision and double vision.  Ears/ Nose/ Throat:   Patient denies sore throat and sinus problems.  Hematologic/Lymphatic:   Patient denies easy bruising and swollen glands.  Cardiovascular:   Patient denies leg swelling and  chest pains.  Respiratory:   Patient denies cough and shortness of breath.  Endocrine:   Patient denies excessive thirst.  Musculoskeletal:   Patient denies back pain and joint pain.  Neurological:   Patient denies headaches and dizziness.  Psychologic:   Patient denies depression and anxiety.   VITAL SIGNS:      11/03/2016 11:16 AM  BP 123/78 mmHg  Pulse 76 /min  Temperature 98.4 F / 37 C   MULTI-SYSTEM PHYSICAL EXAMINATION:    Constitutional: Obese. No physical deformities. Normally developed. Good grooming.   Neck: Neck symmetrical, not swollen. Normal tracheal position.  Respiratory: No labored breathing, no use of accessory muscles.   Cardiovascular: Normal temperature, normal extremity pulses, no swelling, no varicosities.   Lymphatic: No enlargement of neck, axillae, groin.  Skin: No paleness, no jaundice, no cyanosis. No lesion, no ulcer, no rash.  Neurologic / Psychiatric: Oriented to time, oriented to place, oriented to person. No depression, no anxiety, no agitation.   Gastrointestinal: Obese abdomen. No mass, no tenderness, no rigidity.   Ears, Nose, Mouth, and Throat: Left ear no scars, no lesions, no masses. Right ear no scars, no lesions, no masses. Nose no scars, no lesions, no masses. Normal hearing. Normal lips.  Musculoskeletal: Normal gait and station of head and neck.     PAST DATA REVIEWED:  Source Of History:  Patient  Records Review:   Previous Patient Records  X-Ray Review: KUB: Reviewed Films. Discussed With Patient.     10/29/16 01/03/08  PSA  Total PSA 5.45 ng/dl 1.61     09/60/45 40/98/11  Hormones  Testosterone, Total 152.6 pg/dL 914     PROCEDURES:         KUB - 74018  A single view of the abdomen is obtained.               Urinalysis Dipstick Dipstick Cont'd  Color: Yellow Bilirubin: Neg  Appearance: Clear Ketones: Neg  Specific Gravity: 1.025 Blood: Neg  pH: <=5.0 Protein: Neg  Glucose: Neg Urobilinogen: 0.2    Nitrites: Neg     Leukocyte Esterase: Neg    ASSESSMENT:      ICD-10 Details  1 GU:   Ureteral calculus - N20.1   2   Ureteral obstruction secondary to calculous - N13.2 Right          Notes:   I have reviewed CT scan, as well as multiple KUB's. The patient has continued right flank pain, and x-rays show persistence of 6 cm right lower ureteral calculus. He will be scheduled for cystoscopy extraction of right distal ureteral calculus. He will need laser, and probably double J stent.    PLAN:            Medications Stop Meds: Tamsulosin Hcl 0.4 mg capsule, ext release 24 hr 1 capsule PO Daily  Start: 09/24/2016  Stop: 11/23/2016   Discontinue: 11/03/2016  - Reason: The medication cycle was completed.  Zofran Odt 8 mg tablet,disintegrating dissolve 1 tablet PO Q 8 hours PRN N/V  Start: 10/20/2016  Discontinue: 11/03/2016  - Reason: The medication cycle was completed.            Schedule Return Visit/Planned Activity: Next Available Appointment - Schedule Surgery  Document Letter(s):  Created for Patient: Clinical Summary     Signed by Jethro Bolus, M.D. on 11/03/16 at 2:09 PM (EDT     The information contained in this medical record document is considered private and confidential patient information. This information can only be used for the medical diagnosis and/or medical services that are being provided by the patient's selected caregivers. This information can only be distributed outside of the patient's care if the patient agrees and signs waivers of authorization for this information to be sent to an outside source or route.

## 2016-11-12 ENCOUNTER — Ambulatory Visit (HOSPITAL_COMMUNITY): Payer: Commercial Managed Care - PPO | Admitting: Registered Nurse

## 2016-11-12 ENCOUNTER — Ambulatory Visit (HOSPITAL_COMMUNITY)
Admission: RE | Admit: 2016-11-12 | Discharge: 2016-11-12 | Disposition: A | Discharge status: Home / Self Care | Payer: Commercial Managed Care - PPO | Source: Ambulatory Visit | Attending: Urology | Admitting: Urology

## 2016-11-12 ENCOUNTER — Encounter (HOSPITAL_COMMUNITY): Payer: Self-pay | Admitting: *Deleted

## 2016-11-12 ENCOUNTER — Ambulatory Visit (HOSPITAL_COMMUNITY): Payer: Commercial Managed Care - PPO

## 2016-11-12 ENCOUNTER — Encounter (HOSPITAL_COMMUNITY)
Admission: RE | Disposition: A | Discharge status: Home / Self Care | Payer: Self-pay | Source: Ambulatory Visit | Attending: Urology

## 2016-11-12 DIAGNOSIS — N201 Calculus of ureter: Secondary | ICD-10-CM | POA: Insufficient documentation

## 2016-11-12 DIAGNOSIS — Z7982 Long term (current) use of aspirin: Secondary | ICD-10-CM | POA: Insufficient documentation

## 2016-11-12 DIAGNOSIS — E291 Testicular hypofunction: Secondary | ICD-10-CM | POA: Insufficient documentation

## 2016-11-12 DIAGNOSIS — Z720 Tobacco use: Secondary | ICD-10-CM | POA: Insufficient documentation

## 2016-11-12 DIAGNOSIS — N135 Crossing vessel and stricture of ureter without hydronephrosis: Secondary | ICD-10-CM | POA: Insufficient documentation

## 2016-11-12 DIAGNOSIS — N400 Enlarged prostate without lower urinary tract symptoms: Secondary | ICD-10-CM | POA: Insufficient documentation

## 2016-11-12 DIAGNOSIS — K5903 Drug induced constipation: Secondary | ICD-10-CM | POA: Insufficient documentation

## 2016-11-12 DIAGNOSIS — Z8249 Family history of ischemic heart disease and other diseases of the circulatory system: Secondary | ICD-10-CM | POA: Insufficient documentation

## 2016-11-12 DIAGNOSIS — E559 Vitamin D deficiency, unspecified: Secondary | ICD-10-CM | POA: Insufficient documentation

## 2016-11-12 DIAGNOSIS — I10 Essential (primary) hypertension: Secondary | ICD-10-CM | POA: Insufficient documentation

## 2016-11-12 DIAGNOSIS — T39315A Adverse effect of propionic acid derivatives, initial encounter: Secondary | ICD-10-CM | POA: Insufficient documentation

## 2016-11-12 DIAGNOSIS — N528 Other male erectile dysfunction: Secondary | ICD-10-CM | POA: Insufficient documentation

## 2016-11-12 DIAGNOSIS — Z79899 Other long term (current) drug therapy: Secondary | ICD-10-CM | POA: Insufficient documentation

## 2016-11-12 DIAGNOSIS — Z6841 Body Mass Index (BMI) 40.0 and over, adult: Secondary | ICD-10-CM | POA: Insufficient documentation

## 2016-11-12 DIAGNOSIS — I722 Aneurysm of renal artery: Secondary | ICD-10-CM | POA: Insufficient documentation

## 2016-11-12 HISTORY — PX: CYSTOSCOPY WITH RETROGRADE PYELOGRAM, URETEROSCOPY AND STENT PLACEMENT: SHX5789

## 2016-11-12 SURGERY — CYSTOURETEROSCOPY, WITH RETROGRADE PYELOGRAM AND STENT INSERTION
Anesthesia: General | Site: Ureter | Laterality: Right

## 2016-11-12 MED ORDER — EPHEDRINE 5 MG/ML INJ
INTRAVENOUS | Status: AC
  Filled 2016-11-12: qty 10

## 2016-11-12 MED ORDER — SODIUM CHLORIDE 0.9 % IR SOLN
Status: DC | PRN
  Administered 2016-11-12: 4000 mL

## 2016-11-12 MED ORDER — PROPOFOL 10 MG/ML IV BOLUS
INTRAVENOUS | Status: DC | PRN
  Administered 2016-11-12: 50 mg via INTRAVENOUS
  Administered 2016-11-12: 250 mg via INTRAVENOUS

## 2016-11-12 MED ORDER — LIDOCAINE 2% (20 MG/ML) 5 ML SYRINGE
INTRAMUSCULAR | Status: DC | PRN
  Administered 2016-11-12: 100 mg via INTRAVENOUS

## 2016-11-12 MED ORDER — MEPERIDINE HCL 50 MG/ML IJ SOLN: 6.2500 mg | INTRAMUSCULAR | Status: DC | PRN

## 2016-11-12 MED ORDER — PROPOFOL 10 MG/ML IV BOLUS
INTRAVENOUS | Status: AC
  Filled 2016-11-12: qty 20

## 2016-11-12 MED ORDER — FENTANYL CITRATE (PF) 100 MCG/2ML IJ SOLN
INTRAMUSCULAR | Status: DC | PRN
  Administered 2016-11-12: 25 ug via INTRAVENOUS
  Administered 2016-11-12: 50 ug via INTRAVENOUS

## 2016-11-12 MED ORDER — LACTATED RINGERS IV SOLN
INTRAVENOUS | Status: DC
  Administered 2016-11-12: 09:00:00 via INTRAVENOUS

## 2016-11-12 MED ORDER — BELLADONNA ALKALOIDS-OPIUM 16.2-60 MG RE SUPP
RECTAL | Status: DC | PRN
  Administered 2016-11-12: 1 via RECTAL

## 2016-11-12 MED ORDER — ONDANSETRON HCL 4 MG/2ML IJ SOLN
INTRAMUSCULAR | Status: DC | PRN
  Administered 2016-11-12: 4 mg via INTRAVENOUS

## 2016-11-12 MED ORDER — PROMETHAZINE HCL 25 MG/ML IJ SOLN: 6.2500 mg | INTRAMUSCULAR | Status: DC | PRN

## 2016-11-12 MED ORDER — HYOSCYAMINE SULFATE SL 0.125 MG SL SUBL: 0.1250 mg | SUBLINGUAL_TABLET | Freq: Two times a day (BID) | SUBLINGUAL | 0 refills | Status: AC | PRN

## 2016-11-12 MED ORDER — MIDAZOLAM HCL 2 MG/2ML IJ SOLN: 0.5000 mg | Freq: Once | INTRAMUSCULAR | Status: DC | PRN

## 2016-11-12 MED ORDER — TRAMADOL-ACETAMINOPHEN 37.5-325 MG PO TABS: 1.0000 | ORAL_TABLET | Freq: Four times a day (QID) | ORAL | 0 refills | Status: AC | PRN

## 2016-11-12 MED ORDER — PHENYLEPHRINE 40 MCG/ML (10ML) SYRINGE FOR IV PUSH (FOR BLOOD PRESSURE SUPPORT)
PREFILLED_SYRINGE | INTRAVENOUS | Status: DC | PRN
  Administered 2016-11-12 (×2): 80 ug via INTRAVENOUS

## 2016-11-12 MED ORDER — KETOROLAC TROMETHAMINE 30 MG/ML IJ SOLN
INTRAMUSCULAR | Status: DC | PRN
  Administered 2016-11-12: 30 mg via INTRAVENOUS

## 2016-11-12 MED ORDER — EPHEDRINE SULFATE-NACL 50-0.9 MG/10ML-% IV SOSY
PREFILLED_SYRINGE | INTRAVENOUS | Status: DC | PRN
  Administered 2016-11-12: 10 mg via INTRAVENOUS
  Administered 2016-11-12: 5 mg via INTRAVENOUS

## 2016-11-12 MED ORDER — FENTANYL CITRATE (PF) 100 MCG/2ML IJ SOLN
INTRAMUSCULAR | Status: AC
  Filled 2016-11-12: qty 2

## 2016-11-12 MED ORDER — TRIMETHOPRIM 100 MG PO TABS: 100.0000 mg | ORAL_TABLET | ORAL | 0 refills | Status: AC

## 2016-11-12 MED ORDER — MIDAZOLAM HCL 2 MG/2ML IJ SOLN
INTRAMUSCULAR | Status: AC
  Filled 2016-11-12: qty 2

## 2016-11-12 MED ORDER — MIDAZOLAM HCL 5 MG/5ML IJ SOLN
INTRAMUSCULAR | Status: DC | PRN
  Administered 2016-11-12: 2 mg via INTRAVENOUS

## 2016-11-12 MED ORDER — TAMSULOSIN HCL 0.4 MG PO CAPS: 0.4000 mg | ORAL_CAPSULE | Freq: Every day | ORAL | 0 refills | Status: AC

## 2016-11-12 MED ORDER — FENTANYL CITRATE (PF) 100 MCG/2ML IJ SOLN: 25.0000 ug | INTRAMUSCULAR | Status: DC | PRN

## 2016-11-12 MED ORDER — PHENYLEPHRINE 40 MCG/ML (10ML) SYRINGE FOR IV PUSH (FOR BLOOD PRESSURE SUPPORT)
PREFILLED_SYRINGE | INTRAVENOUS | Status: AC
  Filled 2016-11-12: qty 10

## 2016-11-12 MED ORDER — ONDANSETRON HCL 4 MG/2ML IJ SOLN
INTRAMUSCULAR | Status: AC
  Filled 2016-11-12: qty 2

## 2016-11-12 MED ORDER — ACETAMINOPHEN 500 MG PO TABS
1000.0000 mg | ORAL_TABLET | ORAL | Status: AC
  Administered 2016-11-12: 1000 mg via ORAL
  Filled 2016-11-12: qty 2

## 2016-11-12 SURGICAL SUPPLY — 21 items
BAG URO CATCHER STRL LF (MISCELLANEOUS) ×3 IMPLANT
BASKET STNLS GEMINI 4WIRE 3FR (BASKET) IMPLANT
BASKET ZERO TIP NITINOL 2.4FR (BASKET) ×3 IMPLANT
CATH INTERMIT  6FR 70CM (CATHETERS) ×3 IMPLANT
CATH URET DUAL LUMEN 6-10FR 50 (CATHETERS) IMPLANT
CLOTH BEACON ORANGE TIMEOUT ST (SAFETY) ×3 IMPLANT
COVER SURGICAL LIGHT HANDLE (MISCELLANEOUS) ×3 IMPLANT
FIBER LASER FLEXIVA 365 (UROLOGICAL SUPPLIES) ×3 IMPLANT
FIBER LASER TRAC TIP (UROLOGICAL SUPPLIES) IMPLANT
GLOVE BIOGEL M STRL SZ7.5 (GLOVE) ×3 IMPLANT
GOWN STRL REUS W/TWL XL LVL3 (GOWN DISPOSABLE) ×3 IMPLANT
GUIDEWIRE STR DUAL SENSOR (WIRE) ×3 IMPLANT
IV NS 1000ML (IV SOLUTION) ×2
IV NS 1000ML BAXH (IV SOLUTION) ×1 IMPLANT
MANIFOLD NEPTUNE II (INSTRUMENTS) ×3 IMPLANT
PACK CYSTO (CUSTOM PROCEDURE TRAY) ×3 IMPLANT
SHEATH ACCESS URETERAL 38CM (SHEATH) ×3 IMPLANT
STENT URET 6FRX24 CONTOUR (STENTS) ×3 IMPLANT
SYRINGE IRR TOOMEY STRL 70CC (SYRINGE) ×3 IMPLANT
TUBING CONNECTING 10 (TUBING) ×2 IMPLANT
TUBING CONNECTING 10' (TUBING) ×1

## 2016-11-12 NOTE — Discharge Instructions (Addendum)
Dietary Guidelines to Help Prevent Kidney Stones Kidney stones are deposits of minerals and salts that form inside your kidneys. Your risk of developing kidney stones may be greater depending on your diet, your lifestyle, the medicines you take, and whether you have certain medical conditions. Most people can reduce their chances of developing kidney stones by following the instructions below. Depending on your overall health and the type of kidney stones you tend to develop, your dietitian may give you more specific instructions. What are tips for following this plan? Reading food labels   Choose foods with "no salt added" or "low-salt" labels. Limit your sodium intake to less than 1500 mg per day.  Choose foods with calcium for each meal and snack. Try to eat about 300 mg of calcium at each meal. Foods that contain 200-500 mg of calcium per serving include:  8 oz (237 ml) of milk, fortified nondairy milk, and fortified fruit juice.  8 oz (237 ml) of kefir, yogurt, and soy yogurt.  4 oz (118 ml) of tofu.  1 oz of cheese.  1 cup (300 g) of dried figs.  1 cup (91 g) of cooked broccoli.  1-3 oz can of sardines or mackerel.  Most people need 1000 to 1500 mg of calcium each day. Talk to your dietitian about how much calcium is recommended for you. Shopping   Buy plenty of fresh fruits and vegetables. Most people do not need to avoid fruits and vegetables, even if they contain nutrients that may contribute to kidney stones.  When shopping for convenience foods, choose:  Whole pieces of fruit.  Premade salads with dressing on the side.  Low-fat fruit and yogurt smoothies.  Avoid buying frozen meals or prepared deli foods.  Look for foods with live cultures, such as yogurt and kefir. Cooking   Do not add salt to food when cooking. Place a salt shaker on the table and allow each person to add his or her own salt to taste.  Use vegetable protein, such as beans, textured vegetable  protein (TVP), or tofu instead of meat in pasta, casseroles, and soups. Meal planning   Eat less salt, if told by your dietitian. To do this:  Avoid eating processed or premade food.  Avoid eating fast food.  Eat less animal protein, including cheese, meat, poultry, or fish, if told by your dietitian. To do this:  Limit the number of times you have meat, poultry, fish, or cheese each week. Eat a diet free of meat at least 2 days a week.  Eat only one serving each day of meat, poultry, fish, or seafood.  When you prepare animal protein, cut pieces into small portion sizes. For most meat and fish, one serving is about the size of one deck of cards.  Eat at least 5 servings of fresh fruits and vegetables each day. To do this:  Keep fruits and vegetables on hand for snacks.  Eat 1 piece of fruit or a handful of berries with breakfast.  Have a salad and fruit at lunch.  Have two kinds of vegetables at dinner.  Limit foods that are high in a substance called oxalate. These include:  Spinach.  Rhubarb.  Beets.  Potato chips and french fries.  Nuts.  If you regularly take a diuretic medicine, make sure to eat at least 1-2 fruits or vegetables high in potassium each day. These include:  Avocado.  Banana.  Orange, prune, carrot, or tomato juice.  Baked potato.  Cabbage.    General instructions  Drink enough fluid to keep your urine clear or pale yellow. This is the most important thing you can do.  Talk to your health care provider and dietitian about taking daily supplements. Depending on your health and the cause of your kidney stones, you may be advised: ? Not to take supplements with vitamin C. ? To take a calcium supplement. ? To take a daily probiotic supplement. ? To take other supplements such as magnesium, fish oil, or vitamin B6.  Take all medicines and supplements as told by your health care provider.  Limit alcohol intake to no more  than 1 drink a day for nonpregnant women and 2 drinks a day for men. One drink equals 12 oz of beer, 5 oz of wine, or 1 oz of hard liquor.  Lose weight if told by your health care provider. Work with your dietitian to find strategies and an eating plan that works best for you. What foods are not recommended? Limit your intake of the following foods, or as told by your dietitian. Talk to your dietitian about specific foods you should avoid based on the type of kidney stones and your overall health. Grains Breads. Bagels. Rolls. Baked goods. Salted crackers. Cereal. Pasta. Vegetables Spinach. Rhubarb. Beets. Canned vegetables. Rosita FirePickles. Olives. Meats and other protein foods Nuts. Nut butters. Large portions of meat, poultry, or fish. Salted or cured meats. Deli meats. Hot dogs. Sausages. Dairy Cheese. Beverages Regular soft drinks. Regular vegetable juice. Seasonings and other foods Seasoning blends with salt. Salad dressings. Canned soups. Soy sauce. Ketchup. Barbecue sauce. Canned pasta sauce. Casseroles. Pizza. Lasagna. Frozen meals. Potato chips. [place] fries. Summary  You can reduce your risk of kidney stones by making changes to your diet.  The most important thing you can do is drink enough fluid. You should drink enough fluid to keep your urine clear or pale yellow.  Ask your health care provider or dietitian how much protein from animal sources you should eat each day, and also how much salt and calcium you should have each day. This information is not intended to replace advice given to you by your health care provider. Make sure you discuss any questions you have with your health care provider. Document Released: 09/25/2010 Document Revised: 05/11/2016 Document Reviewed: 05/11/2016 Elsevier Interactive Patient Education  2017 [employer] DISCHARGE INSTRUCTIONS FOR KIDNEY STONES OR URETERAL STENT  MEDICATIONS:   1. DO NOT RESUME YOUR ASPIRIN, or any other medicines like  ibuprofen, motrin, excedrin, advil, aleve, vitamin E, fish oil as these can all cause bleeding x 7 days.  2. Resume all your other meds from home - except do not take any other pain meds that you may have at home.  ACTIVITY 1. No strenuous activity x 1week 2. No driving while on narcotic pain medications 3. Drink plenty of water 4. Continue to walk at home - you can still get blood clots when you are at home, so keep active, but don't over do it. 5. May return to work in 3 days.  BATHING 1. You can shower and we recommend daily showers  2. If you have a string coming from your urethra:  The stent string is attached to your ureteral stent.  Do not pull on this.   SIGNS/SYMPTOMS TO CALL: 1. Please call us if you have a fever greater than 101.5, uncontrolled  nausea/vomiting, uncontrolled pain, dizziness, unable to urinate, bloody urine, chest pain, shortness of breath, leg swelling, leg pain, redness around wound,  drainage from wound, or any other concerns or questions.  You can reach [place] at [phone].  FOLLOW-UP You have an appointment: call [phone] for appointment for stent removal.    You may feel an odd sensation in your back. OK for occasional blood in the urine.   You may pull your stent on Sunday. If you have concerns call the office at above number.

## 2016-11-12 NOTE — Op Note (Signed)
Pre-operative diagnosis :   Right lower ureteral stone  Postoperative diagnosis:  Same, plus right lower ureteral stricture  Operation:  Cystoscopy, Right retrograde pyelogram with interpretation, dilation of right lower uretero-vesical junction stricture, ureteroscopy, laser of right lower ureteral stone ( 1cm), basket extraction of stone fragments, passage of JJ stent ( 59F x 24cm stent, with suture attachment).   Surgeon:  Kathie Rhodes. Patsi Sears, MD  First assistant:  None  Anesthesia:  General LMA  Preparation: After appropriate preanesthesia, the patient was brought to the operative room, placed on the operating table in the dorsal supine position where general LMA anesthesia was introduced. He was replaced in the dorsal lithotomy position with the pubis was prepped with Betadine solution and draped in usual fashion. The patient was previously marked on the right flank, and on the right wrist. The history was reviewed. The armband was double checked.  Review history:  HPI: Bryan Melton is a 61 year-old male established patient who is here for ureteral stone.  The problem is on the right side. He first stated noticing pain on 09/16/2016. He is currently having flank pain and nausea. He denies having back pain, groin pain, vomiting, fever, and chills. Pain is occuring on the right side. He has not caught a stone in his urine strainer since his symptoms began.   Having some constipation with PRN pain meds. LBM was 24 hr ago. Did self treated Miralax yesterday. Pain is much improved after BM. No longer using PRN Hydrocodone (ran out) but remains on PRN Ibuprofen and daily Tamsulosin.    Statement of  Likelihood of Success: Excellent. TIME-OUT observed.:  Procedure: Fluoroscopic x-ray showed calcification in the right lower quadrant, consistent with 1 cm right lower ureterovesical junction stone. Cystourethroscopy was accomplished, and showed normal penile meatus, normal pendulous urethra, normal  posterior urethra, and BPH with subtotal hypertrophy. The bladder neck was normal. The bladder itself showed normal trigone, with no trabeculation, no evidence of bladder stone tumor or diverticular formation.  A 6 French open-ended catheter was placed within the ureteral orifice, and retrograde Polygram was performed, which showed showed abnormal calcification in the right lower ureter, consistent with the previously verified stone. Magnification was used to identified the stone, and following this, a 0.038 guidewire was passed through the scope, through the ureter, around the stone, and coiled in the renal pelvis, under fluoroscopic control.  The double bridge semirigid ureteroscope was then passed atraumatically through the urethra and into the bladder, and into the right ureteral orifice. Once through the ureterovesical junction, however, I noted distal ureteral stricture, at the level of the ureterovesical junction. I decided to dilate the area of stricture, consistent with the area of stone disease. The scope was withdrawn, and over the guidewire, I placed the inside of the ureteral dilating sheath, and following this, I placed the entire ureteral dilating sheath under fluoroscopic control. I removed the ureteral dilating sheath, and again placed the semirigid double bridge scope, and was able to identify the stone easily. Using the 365  laser fiber, with settings of 0.5/10, I was able to fractionate the stone, and basket extract the fragments. The fragments were dropped in the bladder, for later extraction, and taken to the office to be sent for evaluation.  Repeat ureteroscopy showed a normal-appearing ureter. Because a ureteral dilation, I elected to pass a double-J stent, and selected a 6 French by 24 cm stent with suture attachment left in place. This was passed over the guidewire, which was backloaded through  the cystoscope, under fluoroscopic control. Following excellent placement in both the  kidney and the bladder, the suture attachment was taped to the patient's penis. He was given Toradol, and awakened, and taken to recovery room in good condition. He received a B and O suppository at the beginning of the procedure. He also received IV antibiotic in the beginning of the procedure.

## 2016-11-12 NOTE — Anesthesia Preprocedure Evaluation (Addendum)
Anesthesia Evaluation  Patient identified by MRN, date of birth, ID band Patient awake    Reviewed: Allergy & Precautions, NPO status , Patient's Chart, lab work & pertinent test results  History of Anesthesia Complications Negative for: history of anesthetic complications  Airway Mallampati: II  TM Distance: >3 FB Neck ROM: Full    Dental  (+) Dental Advisory Given   Pulmonary Current Smoker,    breath sounds clear to auscultation       Cardiovascular hypertension, Pt. on medications (-) angina Rhythm:Regular Rate:Normal     Neuro/Psych negative neurological ROS     GI/Hepatic negative GI ROS, Neg liver ROS,   Endo/Other  Morbid obesity  Renal/GU negative Renal ROS     Musculoskeletal   Abdominal (+) + obese,   Peds  Hematology negative hematology ROS (+)   Anesthesia Other Findings   Reproductive/Obstetrics                            Anesthesia Physical Anesthesia Plan  ASA: II  Anesthesia Plan: General   Post-op Pain Management:    Induction:   Airway Management Planned: LMA  Additional Equipment:   Intra-op Plan:   Post-operative Plan:   Informed Consent: I have reviewed the patients History and Physical, chart, labs and discussed the procedure including the risks, benefits and alternatives for the proposed anesthesia with the patient or authorized representative who has indicated his/her understanding and acceptance.   Dental advisory given  Plan Discussed with: CRNA and Surgeon  Anesthesia Plan Comments: (Plan routine monitors, GA- LMA OK)        Anesthesia Quick Evaluation

## 2016-11-12 NOTE — Interval H&P Note (Signed)
History and Physical Interval Note:  11/12/2016 10:24 AM  Bryan Melton  has presented today for surgery, with the diagnosis of RIGHT URETERAL STONE  The various methods of treatment have been discussed with the patient and family. After consideration of risks, benefits and other options for treatment, the patient has consented to  Procedure(s): CYSTOSCOPY WITH RETROGRADE PYELOGRAM, URETEROSCOPY, LASER RIGHT STONE, BASKET EXTRACTION (Right) as a surgical intervention .  The patient's history has been reviewed, patient examined, no change in status, stable for surgery.  I have reviewed the patient's chart and labs.  Questions were answered to the patient's satisfaction.     [name] I Calleen Alvis

## 2016-11-12 NOTE — Anesthesia Procedure Notes (Signed)
Procedure Name: LMA Insertion Date/Time: 11/12/2016 10:33 AM Performed by: Jarvis NewcomerARMISTEAD, Alexandar Weisenberger A Pre-anesthesia Checklist: Patient identified, Emergency Drugs available, Suction available, Patient being monitored and Timeout performed Patient Re-evaluated:Patient Re-evaluated prior to inductionOxygen Delivery Method: Circle system utilized Preoxygenation: Pre-oxygenation with 100% oxygen Intubation Type: IV induction LMA: LMA with gastric port inserted LMA Size: 5.0 Number of attempts: 2 Placement Confirmation: positive ETCO2 and breath sounds checked- equal and bilateral Tube secured with: Tape Dental Injury: Teeth and Oropharynx as per pre-operative assessment  Comments: Lubricated #4 LMA placed. + ETCO2. Leak noted despite adjusting the LMA. LMA removed. VSS. #5 lubricated LMA placed with ease. +ETCO2. Good seal noted.

## 2016-11-12 NOTE — Anesthesia Postprocedure Evaluation (Signed)
Anesthesia Post Note  Patient: Bryan Melton  Procedure(s) Performed: Procedure(s) (LRB): CYSTOSCOPY WITH RETROGRADE PYELOGRAM, URETEROSCOPY, LASER RIGHT STONE, BASKET EXTRACTION right double j stent (Right)     Patient location during evaluation: PACU Anesthesia Type: General Level of consciousness: awake and alert, patient cooperative and oriented Pain management: pain level controlled Vital Signs Assessment: post-procedure vital signs reviewed and stable Respiratory status: spontaneous breathing, nonlabored ventilation and respiratory function stable Cardiovascular status: blood pressure returned to baseline and stable Postop Assessment: no signs of nausea or vomiting Anesthetic complications: no    Last Vitals:  Vitals:   11/12/16 1230 11/12/16 1345  BP: 118/72 (!) 136/47  Pulse: 72 74  Resp: 18 14  Temp: 36.5 C 36.2 C    Last Pain:  Vitals:   11/12/16 1345  TempSrc: Oral  PainSc:                  [name],E. Kenner Lewan

## 2016-11-12 NOTE — Transfer of Care (Signed)
Immediate Anesthesia Transfer of Care Note  Patient: Bryan Melton  Procedure(s) Performed: Procedure(s): CYSTOSCOPY WITH RETROGRADE PYELOGRAM, URETEROSCOPY, LASER RIGHT STONE, BASKET EXTRACTION right double j stent (Right)  Patient Location: PACU  Anesthesia Type:General  Level of Consciousness: awake, alert , oriented and patient cooperative  Airway & Oxygen Therapy: Patient Spontanous Breathing and Patient connected to face mask oxygen  Post-op Assessment: Report given to RN, Post -op Vital signs reviewed and stable and Patient moving all extremities  Post vital signs: Reviewed and stable  Last Vitals:  Vitals:   11/12/16 0743 11/12/16 1139  BP: 128/68   Pulse: 86 89  Resp: 18 13  Temp: 36.9 C     Last Pain:  Vitals:   11/12/16 0743  TempSrc: Oral      Patients Stated Pain Goal: 4 (11/12/16 0820)  Complications: No apparent anesthesia complications

## 2016-11-13 ENCOUNTER — Encounter (HOSPITAL_COMMUNITY): Payer: Self-pay | Admitting: Urology

## 2017-10-12 ENCOUNTER — Ambulatory Visit: Payer: Commercial Managed Care - PPO | Admitting: Vascular Surgery

## 2017-10-12 ENCOUNTER — Other Ambulatory Visit: Payer: Commercial Managed Care - PPO

## 2017-10-18 ENCOUNTER — Ambulatory Visit
Admission: RE | Admit: 2017-10-18 | Discharge: 2017-10-18 | Disposition: A | Discharge status: Home / Self Care | Payer: Commercial Managed Care - PPO | Source: Ambulatory Visit | Attending: Vascular Surgery | Admitting: Vascular Surgery

## 2017-10-18 DIAGNOSIS — I722 Aneurysm of renal artery: Secondary | ICD-10-CM

## 2017-10-18 MED ORDER — IOPAMIDOL (ISOVUE-300) INJECTION 61%
100.0000 mL | Freq: Once | INTRAVENOUS | Status: AC | PRN
  Administered 2017-10-18: 100 mL via INTRAVENOUS

## 2017-11-09 ENCOUNTER — Ambulatory Visit (INDEPENDENT_AMBULATORY_CARE_PROVIDER_SITE_OTHER): Payer: Commercial Managed Care - PPO | Admitting: Vascular Surgery

## 2017-11-09 ENCOUNTER — Other Ambulatory Visit: Payer: Self-pay

## 2017-11-09 ENCOUNTER — Encounter: Payer: Self-pay | Admitting: Vascular Surgery

## 2017-11-09 VITALS — BP 119/83 | HR 92 | Resp 20 | Ht 71.0 in | Wt 309.5 lb

## 2017-11-09 DIAGNOSIS — I722 Aneurysm of renal artery: Secondary | ICD-10-CM

## 2017-11-09 NOTE — Progress Notes (Signed)
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  HPI:   Bryan Melton is a pleasant 62 y.o. male who I saw in consultation on 10/13/2016 with an 8 mm right renal artery aneurysm.  The patient was asymptomatic.  I recommended a follow-up CT scan in 1 year and he comes in today for that visit.  My feeling was that if this remains stable in size we can stretch the follow-up out to 2 years.  Since I saw him last, he denies any significant abdominal pain or back pain.  He denies hematuria.  His blood pressure has been under good control.  There have been no significant changes in his medical history.  Past Medical History:  Diagnosis Date  . History of kidney stones   . Hypertension   . Vitamin D deficiency     History reviewed. No pertinent family history.  SOCIAL HISTORY: Social History   Tobacco Use  . Smoking status: Heavy Tobacco Smoker    Types: Cigars  . Smokeless tobacco: Never Used  . Tobacco comment: "Only when stressed"  Substance Use Topics  . Alcohol use: No    No Known Allergies  Current Outpatient Medications  Medication Sig Dispense Refill  . amLODipine (NORVASC) 10 MG tablet TAKE 1 TABLET BY MOUTH EVERY DAY IN THE AM  1  . aspirin 325 MG tablet Take 325 mg by mouth daily as needed.    Marland Kitchen CIALIS 10 MG tablet TAKE 1 TABLET BY MOUTH EVERY 72 HOURS AS NEEDED  6  . hydrochlorothiazide (HYDRODIURIL) 25 MG tablet Take 25 mg by mouth daily.  1  . Hyoscyamine Sulfate SL (LEVSIN/SL) 0.125 MG SUBL Place 0.125 mg under the tongue 3 times/day as needed-between meals & bedtime. 15 each 0  . lisinopril (PRINIVIL,ZESTRIL) 40 MG tablet Take 40 mg by mouth daily.  0  . tamsulosin (FLOMAX) 0.4 MG CAPS capsule Take 1 capsule (0.4 mg total) by mouth daily. 30 capsule 0  . traMADol-acetaminophen (ULTRACET) 37.5-325 MG tablet Take 1 tablet by mouth every 6 (six) hours as needed for severe pain. 30  tablet 0  . trimethoprim (TRIMPEX) 100 MG tablet Take 1 tablet (100 mg total) by mouth 1 day or 1 dose. 7 tablet 0   No current facility-administered medications for this visit.     REVIEW OF SYSTEMS:   denotes positive finding,  denotes negative finding Cardiac  Comments:  Chest pain or chest pressure:    Shortness of breath upon exertion:    Short of breath when lying flat:    Irregular heart rhythm:        Vascular    Pain in calf, thigh, or hip brought on by ambulation:    Pain in feet at night that wakes you up from your sleep:     Blood clot in your veins:    Leg swelling:         Pulmonary    Oxygen at home:    Productive cough:     Wheezing:         Neurologic    Sudden weakness in arms or legs:     Sudden numbness in arms or legs:     Sudden onset of difficulty speaking or slurred speech:    Temporary loss of vision in one eye:     Problems with dizziness:         Gastrointestinal  Blood in stool:     Vomited blood:         Genitourinary    Burning when urinating:     Blood in urine:        Psychiatric    Major depression:         Hematologic    Bleeding problems:    Problems with blood clotting too easily:        Skin    Rashes or ulcers:        Constitutional    Fever or chills:     PHYSICAL EXAM:   Vitals:   11/09/17 1113  BP: 119/83  Pulse: 92  Resp: 20  SpO2: 95%  Weight: (!) 309 lb 8 oz (140.4 kg)  Height:  (1.803 m)  Body mass index is 43.17 kg/m.   GENERAL: The patient is a well-nourished male, in no acute distress. The vital signs are documented above. CARDIAC: There is a regular rate and rhythm.  VASCULAR: I do not detect carotid bruits. He has palpable femoral and posterior tibial pulses bilaterally. PULMONARY: There is good air exchange bilaterally without wheezing or rales. ABDOMEN: Soft and non-tender with normal pitched bowel sounds.  I do not detect any abdominal bruits. MUSCULOSKELETAL: There are no major  deformities or cyanosis. NEUROLOGIC: No focal weakness or paresthesias are detected. SKIN: There are no ulcers or rashes noted. PSYCHIATRIC: The patient has a normal affect.  DATA:    CT ABDOMEN WITH CONTRAST: CT the abdomen with contrast shows a 0.8cm in diameter right renal artery aneurysm which is according to the radiologist unchanged in appearance.  In addition there is a nonobstructing left sided renal stone with no evidence of right-sided nephrolithiasis.  MEDICAL ISSUES:   RIGHT RENAL ARTERY ANEURYSM: The aneurysm remains stable at size at 0.8 cm in diameter.  He is asymptomatic.  His blood pressure is under good control.  I think it would be reasonable to stretch his follow-up out to 2 years.  I have ordered a CT scan in 2 years I will see him back at that time.  He is to call sooner if he has problems.  I think the risk of bleeding from aneurysm this small is small and certainly outweighs the risk of repair.  If the aneurysm did enlarge significantly he would certainly be at increased risk for repair given his obesity.  Fortunately, the aneurysm is quite small.  Bryan Melton [facility] [phone]

## 2019-09-27 ENCOUNTER — Other Ambulatory Visit: Payer: Self-pay

## 2019-09-27 DIAGNOSIS — I722 Aneurysm of renal artery: Secondary | ICD-10-CM

## 2019-10-26 ENCOUNTER — Other Ambulatory Visit: Payer: Self-pay

## 2019-10-26 DIAGNOSIS — I722 Aneurysm of renal artery: Secondary | ICD-10-CM

## 2019-10-29 ENCOUNTER — Ambulatory Visit
Admission: RE | Admit: 2019-10-29 | Discharge: 2019-10-29 | Disposition: A | Discharge status: Home / Self Care | Payer: Managed Care, Other (non HMO) | Source: Ambulatory Visit | Attending: Vascular Surgery | Admitting: Vascular Surgery

## 2019-10-29 DIAGNOSIS — I722 Aneurysm of renal artery: Secondary | ICD-10-CM

## 2019-10-29 MED ORDER — IOPAMIDOL (ISOVUE-300) INJECTION 61%
125.0000 mL | Freq: Once | INTRAVENOUS | Status: AC | PRN
  Administered 2019-10-29: 125 mL via INTRAVENOUS

## 2019-11-06 ENCOUNTER — Telehealth (HOSPITAL_COMMUNITY): Payer: Self-pay

## 2019-11-06 NOTE — Telephone Encounter (Signed)

## 2019-11-07 ENCOUNTER — Ambulatory Visit (INDEPENDENT_AMBULATORY_CARE_PROVIDER_SITE_OTHER): Payer: Managed Care, Other (non HMO) | Admitting: Vascular Surgery

## 2019-11-07 ENCOUNTER — Other Ambulatory Visit: Payer: Self-pay

## 2019-11-07 ENCOUNTER — Encounter: Payer: Self-pay | Admitting: Vascular Surgery

## 2019-11-07 VITALS — BP 121/84 | HR 100 | Temp 97.3°F | Resp 20 | Ht 71.0 in | Wt 335.0 lb

## 2019-11-07 DIAGNOSIS — I722 Aneurysm of renal artery: Secondary | ICD-10-CM

## 2019-11-07 NOTE — Progress Notes (Signed)
REASON FOR VISIT:   Follow-up of right renal artery aneurysm  MEDICAL ISSUES:   RIGHT RENAL ARTERY ANEURYSM: By my measurement the right renal artery aneurysm is stable in size.  I measured it at 9 mm where it had been 8 mm back in 2018.  He has had no hematuria.  His blood pressure has been under good control.  He does have a left interpolar renal calculus that is asymptomatic.  Given that this aneurysm has remained stable in size I think we can continue at a 2-year follow-up interval unless this enlarges significantly.  I think this can only be followed with CT scan.  I have ordered a CT of the abdomen with contrast in 2 years and I will see him back at that time.  He knows to call sooner if he has problems.   HPI:   Bryan Melton is a pleasant 64 y.o. male who I have been following the right renal artery aneurysm.  2018 this measured 8 mm in diameter.  I most recently saw him in May 2021 when this also measured 8 mm in diameter.  Of note the patient did have a nonobstructing left-sided renal stone without evidence of hydronephrosis and no nephrolithiasis on the right.  He denies any abdominal pain or flank pain.  He denies any history of hematuria.  His blood pressures been under good control.  There have been no significant changes to his medical history.  Past Medical History:  Diagnosis Date  . History of kidney stones   . Hypertension   . Vitamin D deficiency     History reviewed. No pertinent family history.  SOCIAL HISTORY: Social History   Tobacco Use  . Smoking status: Heavy Tobacco Smoker    Types: Cigars  . Smokeless tobacco: Never Used  . Tobacco comment: "Only when stressed"  Substance Use Topics  . Alcohol use: No    No Known Allergies  Current Outpatient Medications  Medication Sig Dispense Refill  . amLODipine (NORVASC) 10 MG tablet TAKE 1 TABLET BY MOUTH EVERY DAY IN THE AM  1  . aspirin 325 MG tablet Take 325 mg by mouth daily as needed.    .  hydrochlorothiazide (HYDRODIURIL) 25 MG tablet Take 25 mg by mouth daily.  1  . lisinopril (PRINIVIL,ZESTRIL) 40 MG tablet Take 40 mg by mouth daily.  0  . CIALIS 10 MG tablet TAKE 1 TABLET BY MOUTH EVERY 72 HOURS AS NEEDED  6  . Hyoscyamine Sulfate SL (LEVSIN/SL) 0.125 MG SUBL Place 0.125 mg under the tongue 3 times/day as needed-between meals & bedtime. (Patient not taking: Reported on 11/07/2019) 15 each 0  . tamsulosin (FLOMAX) 0.4 MG CAPS capsule Take 1 capsule (0.4 mg total) by mouth daily. (Patient not taking: Reported on 11/07/2019) 30 capsule 0  . traMADol-acetaminophen (ULTRACET) 37.5-325 MG tablet Take 1 tablet by mouth every 6 (six) hours as needed for severe pain. (Patient not taking: Reported on 11/07/2019) 30 tablet 0  . trimethoprim (TRIMPEX) 100 MG tablet Take 1 tablet (100 mg total) by mouth 1 day or 1 dose. (Patient not taking: Reported on 11/07/2019) 7 tablet 0   No current facility-administered medications for this visit.    REVIEW OF SYSTEMS:  [X]  denotes positive finding, [ ]  denotes negative finding Cardiac  Comments:  Chest pain or chest pressure:    Shortness of breath upon exertion:    Short of breath when lying flat:    Irregular heart rhythm:  Vascular    Pain in calf, thigh, or hip brought on by ambulation:    Pain in feet at night that wakes you up from your sleep:     Blood clot in your veins:    Leg swelling:         Pulmonary    Oxygen at home:    Productive cough:     Wheezing:         Neurologic    Sudden weakness in arms or legs:     Sudden numbness in arms or legs:     Sudden onset of difficulty speaking or slurred speech:    Temporary loss of vision in one eye:     Problems with dizziness:         Gastrointestinal    Blood in stool:     Vomited blood:         Genitourinary    Burning when urinating:     Blood in urine:        Psychiatric    Major depression:         Hematologic    Bleeding problems:    Problems with blood  clotting too easily:        Skin    Rashes or ulcers:        Constitutional    Fever or chills:     PHYSICAL EXAM:   Vitals:   11/07/19 1040  BP: 121/84  Pulse: 100  Resp: 20  Temp: (!) 97.3 F (36.3 C)  SpO2: 95%  Weight: (!) 335 lb (152 kg)  Height: 5\' 11"  (1.803 m)    GENERAL: The patient is a well-nourished male, in no acute distress. The vital signs are documented above. CARDIAC: There is a regular rate and rhythm.  VASCULAR: I do not detect carotid bruits. He has palpable pedal pulses.  Both feet are warm and well perfused. PULMONARY: There is good air exchange bilaterally without wheezing or rales. ABDOMEN: Soft and non-tender with normal pitched bowel sounds.  MUSCULOSKELETAL: There are no major deformities or cyanosis. NEUROLOGIC: No focal weakness or paresthesias are detected. SKIN: There are no ulcers or rashes noted. PSYCHIATRIC: The patient has a normal affect.  DATA:    CT ABDOMEN: The radiologist measured the renal artery aneurysm at 13 mm however I was unable to reproduce that measurement.  I obtained a measurement of 9 mm.  Regardless I do not think that aneurysm has increased significantly in size.  He also has a 7 mm interpolar left renal calculus which is not new.  He has some stable small renal cysts.   Deitra Mayo [facility] [phone]

## 2021-05-01 IMAGING — CT CT ABDOMEN W/ CM
2 of 5 series · 15 of 46 positions shown, 17 images · IV contrast (iopamidol)
Comparison: CT scan 10/18/2017

CLINICAL DATA: Followup renal artery aneurysm.

EXAM:
CT ABDOMEN WITH CONTRAST
TECHNIQUE: Multidetector CT imaging of the abdomen was performed using the
standard protocol following bolus administration of intravenous
contrast.
CONTRAST:  125mL GC8VOC-OEE IOPAMIDOL (GC8VOC-OEE) INJECTION 61%

[Series 2: abd with 2.00 br40 s3 axial · axial · 0.95mm/px · z∈[+1479,+1761]mm · 12 of 157 slices shown, 14 images]
[im 8/157  soft-tissue]
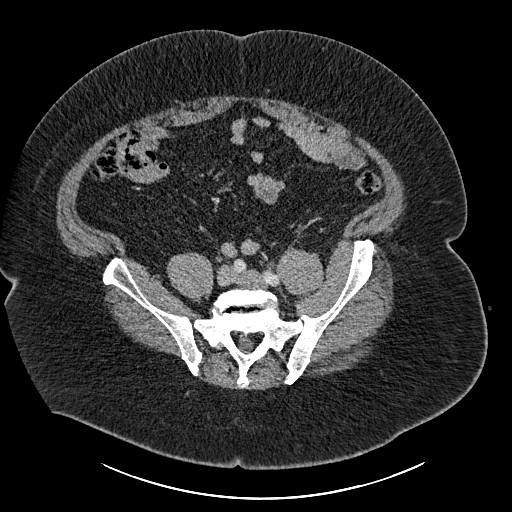
[im 8/157  bone]
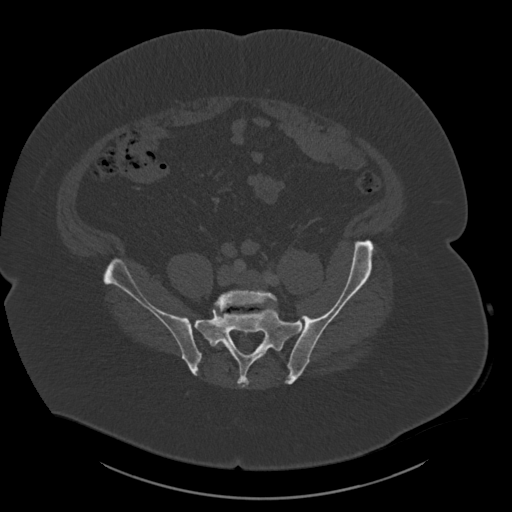
[im 22/157  soft-tissue]
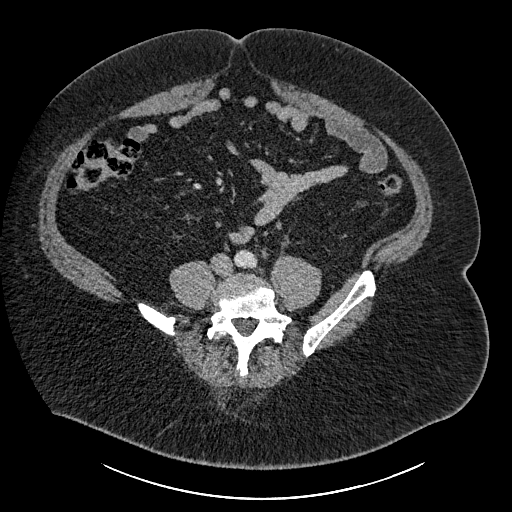
[im 36/157  soft-tissue]
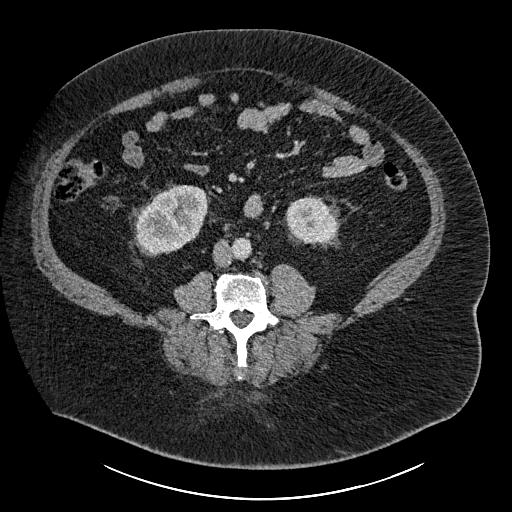
[im 50/157  soft-tissue]
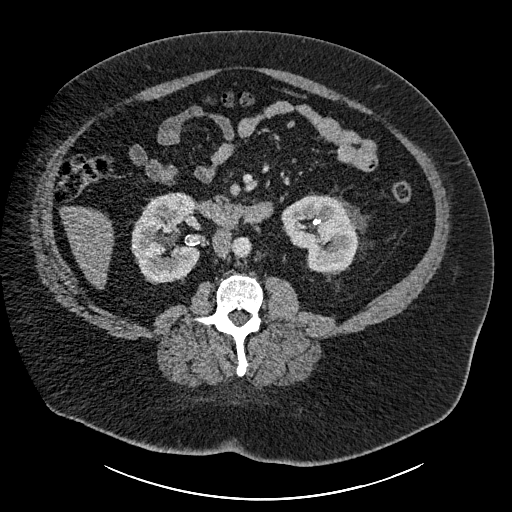
[im 57/157  soft-tissue]
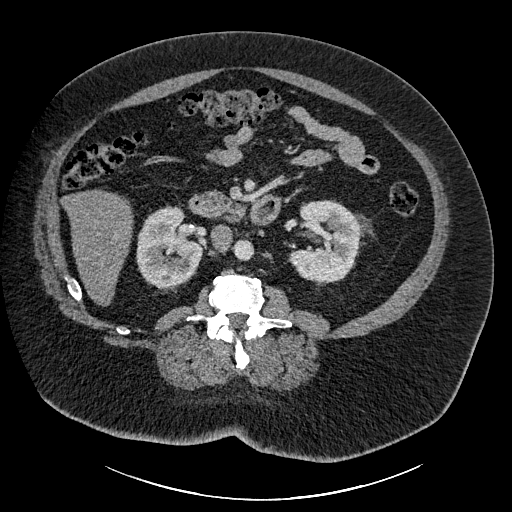
[im 71/157  soft-tissue]
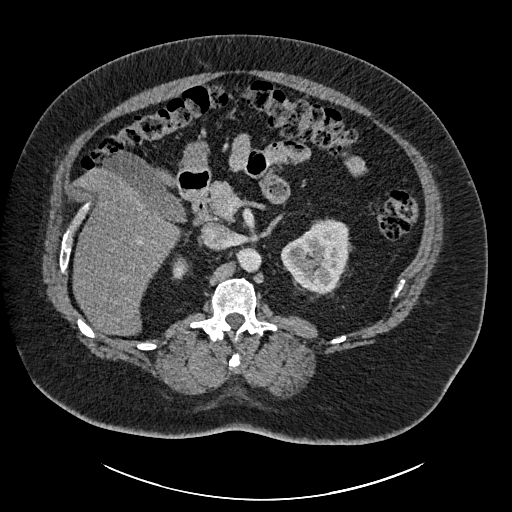
[im 86/157  soft-tissue]
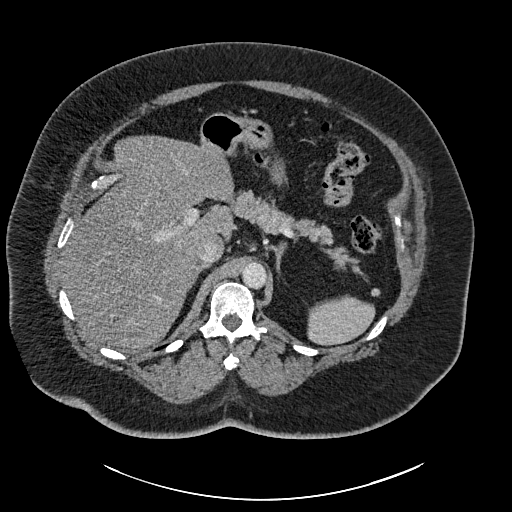
[im 100/157  soft-tissue]
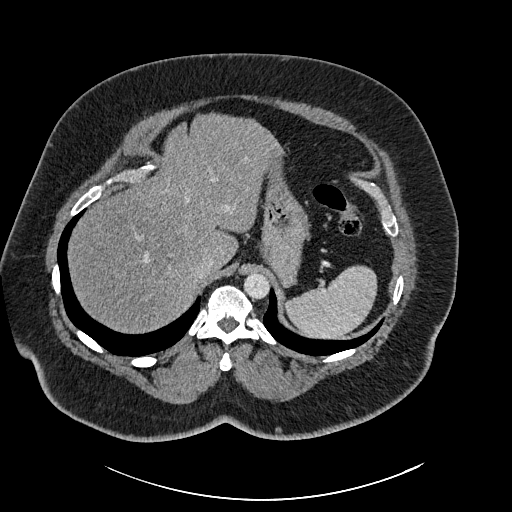
[im 107/157  soft-tissue]
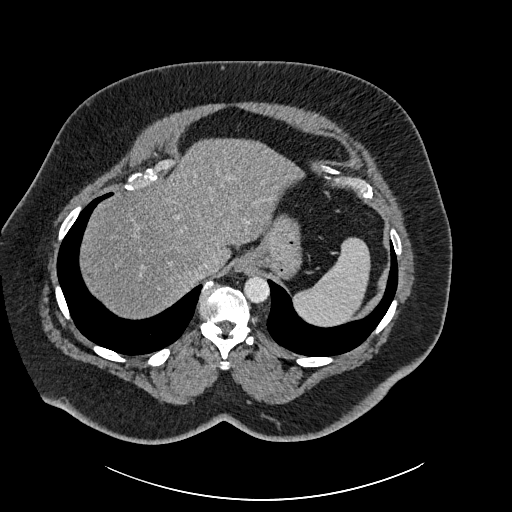
[im 107/157  bone]
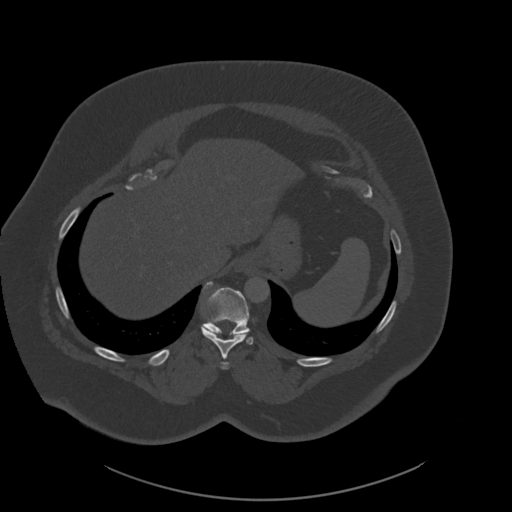
[im 121/157  soft-tissue]
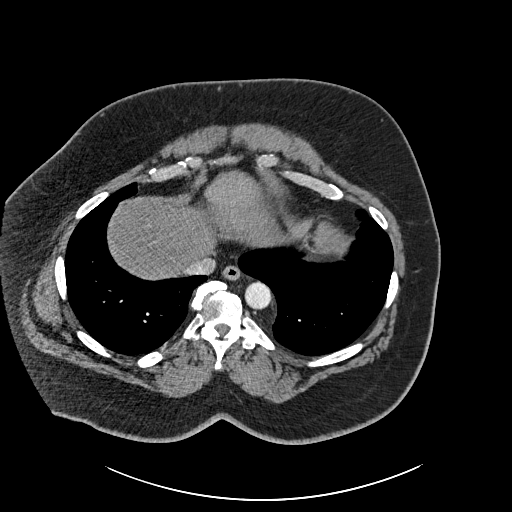
[im 135/157  soft-tissue]
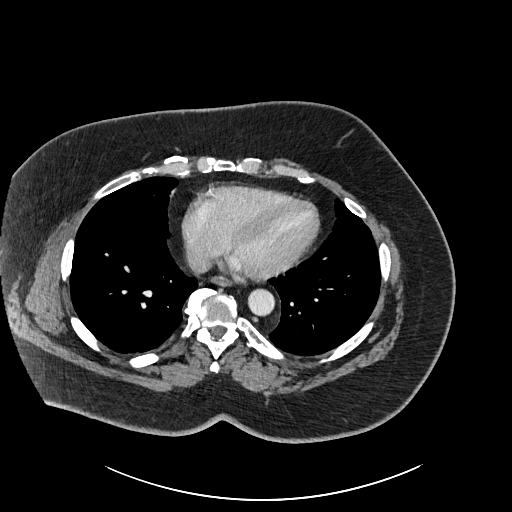
[im 149/157  soft-tissue]
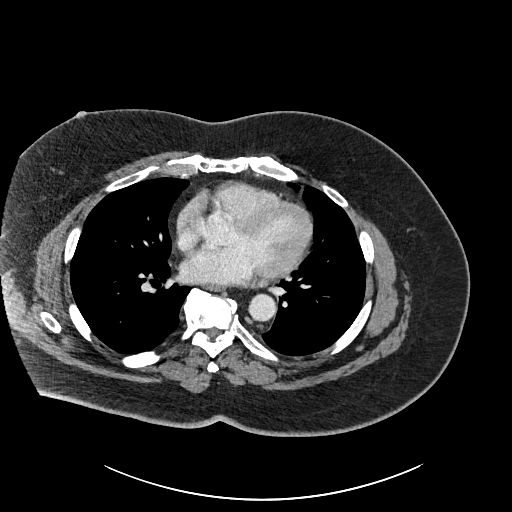

[Series 8: abd with 2.00 br40 s3 cor · coronal · 0.62mm/px · 3 of 243 slices shown]
[im 81/243  soft-tissue]
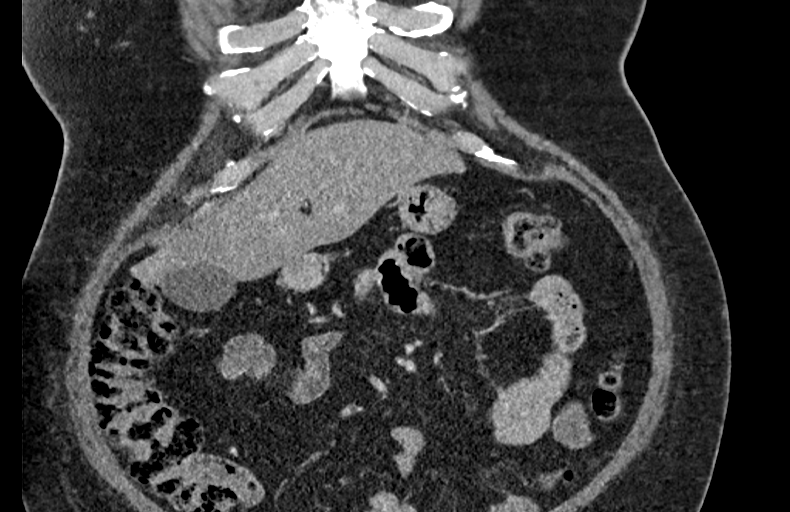
[im 108/243  soft-tissue]
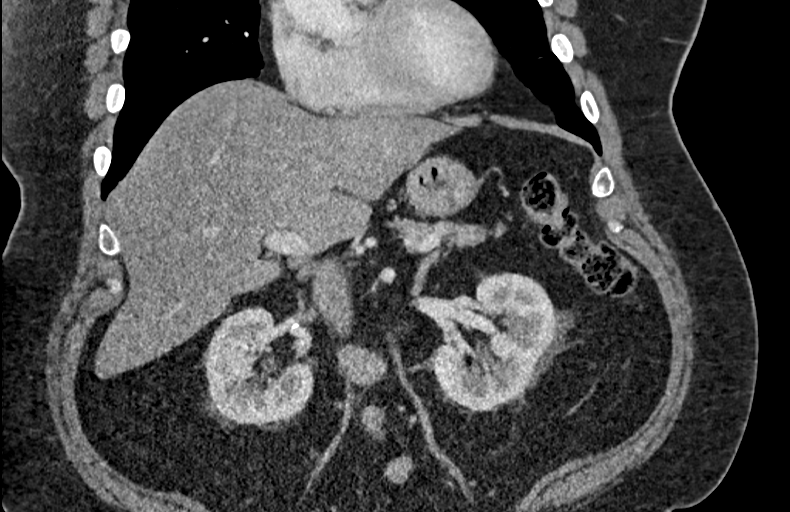
[im 135/243  soft-tissue]
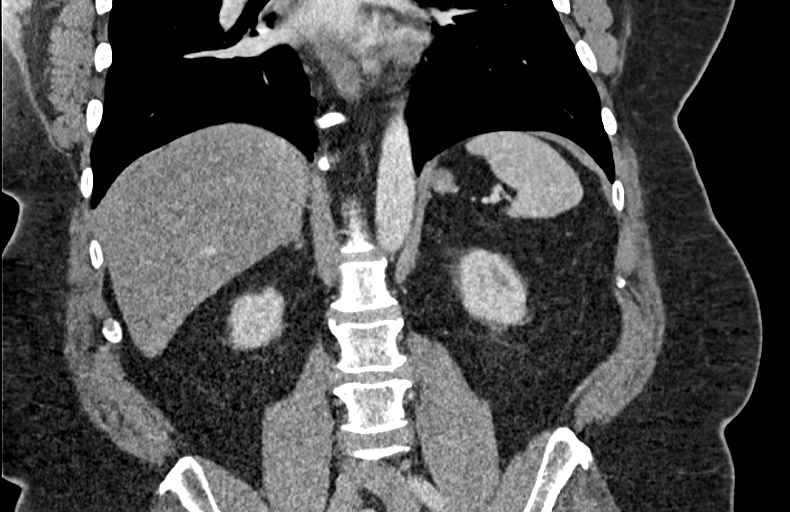

[15 of 46 positions shown; findings below may reference images not displayed]

FINDINGS: Lower chest: The lung bases are clear of acute process. No pleural
effusion or pulmonary lesions. The heart is normal in size. No
pericardial effusion. The distal esophagus and aorta are
unremarkable.

Hepatobiliary: Diffuse fatty infiltration of the liver. No focal
hepatic lesions or intrahepatic biliary dilatation. The gallbladder
appears normal. No common bile duct dilatation. The portal and
hepatic veins are patent.

Pancreas: No mass, inflammation or ductal dilatation.

Spleen: Normal size. No focal lesions.

Adrenals/Urinary Tract: The adrenal glands are unremarkable and
stable.

7 mm interpolar left renal calculus is noted.

Stable rim calcified right renal artery aneurysm measuring a maximum
of 13 mm. No progressive findings.

No worrisome renal lesions. Small renal cysts are noted. No
collecting system abnormalities are identified on the delayed
images.

Stomach/Bowel: The stomach, duodenum, visualized small bowel and
visualized colon are unremarkable. No inflammatory changes or mass
lesions.

Vascular/Lymphatic: The aorta is normal in caliber. Minimal
scattered atherosclerotic calcifications. The branch vessels are
patent. Stable calcification at the left renal artery ostia.

The major venous structures are patent.

No mesenteric or retroperitoneal mass or adenopathy.

Other: No ascites or abdominal wall hernia.

Musculoskeletal: No significant bony findings.
IMPRESSION: 1. Stable 13 mm rim calcified right renal artery aneurysm.
2. 7 mm interpolar left renal calculus.
3. No worrisome renal lesions.  Stable small renal cysts.
4. Diffuse fatty infiltration of the liver.
